# Patient Record
Sex: Female | Born: 1975
Health system: Southern US, Community
[De-identification: ages and names within clinical notes are randomized; demographics above are authoritative.]

## PROBLEM LIST (undated history)

## (undated) DIAGNOSIS — R87610 Atypical squamous cells of undetermined significance on cytologic smear of cervix (ASC-US): Secondary | ICD-10-CM

## (undated) HISTORY — DX: Atypical squamous cells of undetermined significance on cytologic smear of cervix (ASC-US): R87.610

---

## 2013-04-28 ENCOUNTER — Emergency Department (INDEPENDENT_AMBULATORY_CARE_PROVIDER_SITE_OTHER): Payer: Medicaid Other

## 2013-04-28 ENCOUNTER — Encounter (HOSPITAL_COMMUNITY): Payer: Self-pay | Admitting: *Deleted

## 2013-04-28 ENCOUNTER — Emergency Department (INDEPENDENT_AMBULATORY_CARE_PROVIDER_SITE_OTHER)
Admission: EM | Admit: 2013-04-28 | Discharge: 2013-04-28 | Disposition: A | Discharge status: Home / Self Care | Payer: Medicaid Other | Source: Home / Self Care

## 2013-04-28 DIAGNOSIS — K5900 Constipation, unspecified: Secondary | ICD-10-CM

## 2013-04-28 DIAGNOSIS — R109 Unspecified abdominal pain: Secondary | ICD-10-CM

## 2013-04-28 DIAGNOSIS — K5909 Other constipation: Secondary | ICD-10-CM

## 2013-04-28 LAB — POCT PREGNANCY, URINE: Preg Test, Ur: NEGATIVE

## 2013-04-28 LAB — POCT URINALYSIS DIP (DEVICE)
Bilirubin Urine: NEGATIVE
Ketones, ur: NEGATIVE mg/dL
Leukocytes, UA: NEGATIVE
Specific Gravity, Urine: 1.025 (ref 1.005–1.030)
pH: 7 (ref 5.0–8.0)

## 2013-04-28 MED ORDER — DOCUSATE SODIUM 100 MG PO CAPS: 100.0000 mg | ORAL_CAPSULE | Freq: Two times a day (BID) | ORAL | Status: DC | PRN

## 2013-04-28 MED ORDER — POLYETHYLENE GLYCOL 3350 17 G PO PACK: 17.0000 g | PACK | Freq: Every day | ORAL | Status: DC

## 2013-04-28 NOTE — ED Notes (Signed)
C/O intermittent left flank pain x approx 1 month, with gradual worsening.  When pain occurs it is stabbing and quite painful.  Denies any urinary sxs.  Has occasionally had some lower abd pain, but inconsistently.  Denies any vaginal discharge.  Denies fevers or vomiting.  Has had some slight nausea.  States pain is worse with sexual intercourse, sitting, or if any pressure applied to flank area.

## 2013-04-28 NOTE — Patient Instructions (Signed)
Place constipation patient instructions here.

## 2013-04-28 NOTE — ED Provider Notes (Signed)
History     CSN: [id number]  Arrival date & time 04/28/13  1120   First MD Initiated Contact with Patient 04/28/13 1200      Chief Complaint  Patient presents with  . Flank Pain    (Consider location/radiation/quality/duration/timing/severity/associated sxs/prior treatment) Patient is a 37 y.o. female presenting with constipation. The history is provided by the patient.  Constipation Severity:  No pain Timing:  Constant Progression:  Unchanged Chronicity:  Chronic Context: not dehydration, not dietary changes, not medication, not narcotics and not stress   Stool description:  Hard Unusual stool frequency:  Has one bm a week Worsened by:  Nothing tried Associated symptoms comment:  Gets intermittent pain in her left flank area that comes and goes.  No fever, chills, n,v or diarreha.  No urinary sx.   History reviewed. No pertinent past medical history.  History reviewed. No pertinent past surgical history.  No family history on file.  History  Substance Use Topics  . Smoking status: Never Smoker   . Smokeless tobacco: Not on file  . Alcohol Use: No    OB History   Grav Para Term Preterm Abortions TAB SAB Ect Mult Living                  Review of Systems  Gastrointestinal: Positive for constipation.  All other systems reviewed and are negative.    Allergies  Review of patient's allergies indicates no known allergies.  Home Medications   Current Outpatient Rx  Name  Route  Sig  Dispense  Refill  . Cyanocobalamin (VITAMIN B12 PO)   Oral   Take by mouth.         . docusate sodium (COLACE) 100 MG capsule   Oral   Take 1 capsule (100 mg total) by mouth 2 (two) times daily as needed for constipation.   30 capsule   3   . polyethylene glycol (MIRALAX) packet   Oral   Take 17 g by mouth daily.   14 each   3     BP 120/74  Pulse 57  Temp(Src) 98.1 F (36.7 C) (Oral)  Resp 16  SpO2 98%  LMP 04/19/2013  Physical Exam  Nursing note and  vitals reviewed. Constitutional: She is oriented to person, place, and time. She appears well-developed and well-nourished. No distress.  HENT:  Head: Normocephalic and atraumatic.  Eyes: Conjunctivae are normal. Pupils are equal, round, and reactive to light.  Neck: Normal range of motion. Neck supple.  Cardiovascular: Normal rate and regular rhythm.  Exam reveals no gallop and no friction rub.   No murmur heard. Pulmonary/Chest: Effort normal and breath sounds normal. No respiratory distress. She has no wheezes. She has no rales. She exhibits no tenderness.  Abdominal: Soft. Bowel sounds are normal. She exhibits no distension and no mass. There is no tenderness. There is no rebound and no guarding.  No CVA ttp  Musculoskeletal: Normal range of motion.  Neurological: She is alert and oriented to person, place, and time.  Skin: Skin is warm and dry. No rash noted. She is not diaphoretic. No erythema. No pallor.  Psychiatric: She has a normal mood and affect. Her behavior is normal.    ED Course  Procedures (including critical care time)  Labs Reviewed  POCT URINALYSIS DIP (DEVICE) - Abnormal; Notable for the following:    Protein, ur 30 (*)    All other components within normal limits  POCT PREGNANCY, URINE   Dg Abd 1 View  04/28/2013   *RADIOLOGY REPORT*  Clinical Data: Left flank pain.  Diarrhea.  ABDOMEN - 1 VIEW  Comparison: None.  Findings: There is a moderate amount of colonic gas but the pattern does not suggest obstruction.  No abnormal bony findings.  No abnormal soft tissue calcifications.  There are some phleboliths in the pelvis.  IMPRESSION: Within normal limits.  Moderate amount of colonic gas, but without a pathologic pattern.   Original Report Authenticated By: Paulina Fusi, M.D.     1. Flank pain   2. Chronic constipation       MDM  Discussed rx for conspiation.  See meds rx and pt ed.  Discussed that this is a crhornic problem for her and she likely needs to  change her diet.  Strategies revewiwed and meds reviewed.        Maryelizabeth Rowan, MD 04/28/13 1311

## 2013-04-28 NOTE — ED Notes (Signed)
Pt smiling, talkative, no difficulty moving.

## 2014-12-17 ENCOUNTER — Other Ambulatory Visit: Payer: Self-pay | Admitting: Gastroenterology

## 2014-12-17 DIAGNOSIS — R140 Abdominal distension (gaseous): Secondary | ICD-10-CM

## 2014-12-22 ENCOUNTER — Ambulatory Visit
Admission: RE | Admit: 2014-12-22 | Discharge: 2014-12-22 | Disposition: A | Discharge status: Home / Self Care | Payer: BLUE CROSS/BLUE SHIELD | Source: Ambulatory Visit | Attending: Gastroenterology | Admitting: Gastroenterology

## 2014-12-22 DIAGNOSIS — R140 Abdominal distension (gaseous): Secondary | ICD-10-CM

## 2014-12-26 ENCOUNTER — Ambulatory Visit: Payer: Self-pay | Admitting: Women's Health

## 2015-01-08 ENCOUNTER — Encounter: Payer: Self-pay | Admitting: Women's Health

## 2015-01-08 ENCOUNTER — Ambulatory Visit (INDEPENDENT_AMBULATORY_CARE_PROVIDER_SITE_OTHER): Payer: BLUE CROSS/BLUE SHIELD | Admitting: Women's Health

## 2015-01-08 ENCOUNTER — Other Ambulatory Visit (HOSPITAL_COMMUNITY)
Admission: RE | Admit: 2015-01-08 | Discharge: 2015-01-08 | Disposition: A | Discharge status: Home / Self Care | Payer: BLUE CROSS/BLUE SHIELD | Source: Ambulatory Visit | Attending: Women's Health | Admitting: Women's Health

## 2015-01-08 VITALS — BP 110/74 | Ht 65.0 in | Wt 128.0 lb

## 2015-01-08 DIAGNOSIS — N926 Irregular menstruation, unspecified: Secondary | ICD-10-CM

## 2015-01-08 DIAGNOSIS — B373 Candidiasis of vulva and vagina: Secondary | ICD-10-CM

## 2015-01-08 DIAGNOSIS — R87810 Cervical high risk human papillomavirus (HPV) DNA test positive: Secondary | ICD-10-CM | POA: Insufficient documentation

## 2015-01-08 DIAGNOSIS — Z01411 Encounter for gynecological examination (general) (routine) with abnormal findings: Secondary | ICD-10-CM | POA: Diagnosis present

## 2015-01-08 DIAGNOSIS — B3731 Acute candidiasis of vulva and vagina: Secondary | ICD-10-CM

## 2015-01-08 DIAGNOSIS — Z1151 Encounter for screening for human papillomavirus (HPV): Secondary | ICD-10-CM | POA: Insufficient documentation

## 2015-01-08 DIAGNOSIS — R1030 Lower abdominal pain, unspecified: Secondary | ICD-10-CM

## 2015-01-08 DIAGNOSIS — Z01419 Encounter for gynecological examination (general) (routine) without abnormal findings: Secondary | ICD-10-CM

## 2015-01-08 LAB — WET PREP FOR TRICH, YEAST, CLUE
CLUE CELLS WET PREP: NONE SEEN
Trich, Wet Prep: NONE SEEN
WBC, Wet Prep HPF POC: NONE SEEN
Yeast Wet Prep HPF POC: NONE SEEN

## 2015-01-08 MED ORDER — FLUCONAZOLE 100 MG PO TABS: ORAL_TABLET | ORAL | Status: DC

## 2015-01-08 NOTE — Progress Notes (Signed)
Ilona SorrelJatonya Jersey 12/20/1975 [id number]    History:    Presents for annual exam.  Monthly cycle/condoms/same partner 6 years. Reports cycles have changed in the past year, increased clots, every 3-5 weeks for 4 days. Has had a Mirena IUD in the past did not do well with. One abnormal Pap years ago with negative colposcopy. Has had problems with chronic constipation is on MiraLAX. Reports sharp low abdominal pain intermittent. Not always relieved with bowel movement. Moved here from [place].  Past medical history, past surgical history, family history and social history were all reviewed and documented in the EPIC chart. Works part-time at [employer]. Children 17, 11, 6 all doing well. 4 stepsons. Parents healthy living in [place].  ROS:  A ROS was performed and pertinent positives and negatives are included.  Exam:  Filed Vitals:   01/08/15 0952  BP: 110/74    General appearance:  Normal Thyroid:  Symmetrical, normal in size, without palpable masses or nodularity. Respiratory  Auscultation:  Clear without wheezing or rhonchi Cardiovascular  Auscultation:  Regular rate, without rubs, murmurs or gallops  Edema/varicosities:  Not grossly evident Abdominal  Soft,nontender, without masses, guarding or rebound.  Liver/spleen:  No organomegaly noted  Hernia:  None appreciated  Skin  Inspection:  Grossly normal   Breasts: Examined lying and sitting.     Right: Without masses, retractions, discharge or axillary adenopathy.     Left: Without masses, retractions, discharge or axillary adenopathy. Gentitourinary   Inguinal/mons:  Normal without inguinal adenopathy  External genitalia:  Normal  BUS/Urethra/Skene's glands:  Normal  Vagina:  Normal menses  Cervix:  Normal  Uterus:   normal in size, shape and contour.  Midline and mobile  Adnexa/parametria:     Rt: Without masses or tenderness.   Lt: Without masses or tenderness.  Anus and perineum: Normal  Digital rectal exam: Normal  sphincter tone without palpated masses or tenderness  Assessment/Plan:  39 y.o. SBF G3 P3 for annual exam intermittent lower abdominal sharp pain  Cycles every 3-5 weeks for 4 days/condoms Contraception management Chronic constipation better on MiraLAX  Plan: Contraception options reviewed, Nexplanon reviewed, reviewed possible spotting initially. Dr. Audie BoxFontaine to place with next cycle will check coverage. SBE's, exercise, calcium rich diet, vitamin D 1000 daily encouraged. Reviewed importance of increasing fiber rich foods in diet. Has had problems with chronic yeast in the past reports uses occasional Diflucan 200, prescription given. Aware to use sparingly. Office visit if no relief. Wet prep negative today, reports asymptomatic today. TSH, prolactin, UA, Pap with HR HPV typing. Ultrasound will schedule after this cycle.  Harrington ChallengerYOUNG,Rachelanne Whidby J [facility]WHNP, 10:48 AM 01/08/2015

## 2015-01-08 NOTE — Patient Instructions (Signed)

## 2015-01-09 ENCOUNTER — Encounter: Payer: Self-pay | Admitting: Women's Health

## 2015-01-09 LAB — URINALYSIS W MICROSCOPIC + REFLEX CULTURE
Bilirubin Urine: NEGATIVE
CASTS: NONE SEEN
Crystals: NONE SEEN
GLUCOSE, UA: NEGATIVE mg/dL
KETONES UR: NEGATIVE mg/dL
Nitrite: NEGATIVE
PH: 7 (ref 5.0–8.0)
PROTEIN: NEGATIVE mg/dL
Specific Gravity, Urine: 1.025 (ref 1.005–1.030)
Urobilinogen, UA: 1 mg/dL (ref 0.0–1.0)

## 2015-01-09 LAB — PROLACTIN: PROLACTIN: 6.3 ng/mL

## 2015-01-09 LAB — TSH: TSH: 1.099 u[IU]/mL (ref 0.350–4.500)

## 2015-01-10 LAB — URINE CULTURE
COLONY COUNT: NO GROWTH
ORGANISM ID, BACTERIA: NO GROWTH

## 2015-01-13 LAB — CYTOLOGY - PAP

## 2015-01-16 ENCOUNTER — Ambulatory Visit (INDEPENDENT_AMBULATORY_CARE_PROVIDER_SITE_OTHER): Payer: BLUE CROSS/BLUE SHIELD | Admitting: Women's Health

## 2015-01-16 ENCOUNTER — Ambulatory Visit (INDEPENDENT_AMBULATORY_CARE_PROVIDER_SITE_OTHER): Payer: BLUE CROSS/BLUE SHIELD

## 2015-01-16 ENCOUNTER — Encounter: Payer: Self-pay | Admitting: Women's Health

## 2015-01-16 DIAGNOSIS — R1030 Lower abdominal pain, unspecified: Secondary | ICD-10-CM

## 2015-01-16 DIAGNOSIS — R140 Abdominal distension (gaseous): Secondary | ICD-10-CM

## 2015-01-16 NOTE — Progress Notes (Signed)
Patient ID: Christy SorrelJatonya Allen, female   DOB: 09/24/1976, 39 y.o.   MRN: [id number] Presents for ultrasound. At annual exam was having low midline intermittent sharp pain abdominal discomfort and bloating. Has seen a GI doctor-negative workup.  Denies urinary symptoms, discharge, fever. Has had problems with intermittent constipation.  Ultrasound: Transvaginal uterus anteverted homogeneous. Endometrium normal. Right and left ovary normal. Left ovarian follicle seen 22 mm. Negative cul-de-sac. No apparent mass right or left adnexal.  Normal GYN ultrasound  Plan: Reviewed normality of ultrasound. Discussed diet, will decrease gluten in diet to see if that helps, continue to increase fiber rich foods.

## 2015-02-25 ENCOUNTER — Telehealth: Payer: Self-pay | Admitting: Gynecology

## 2015-02-25 NOTE — Telephone Encounter (Signed)
02/25/15-I LM VM for pt that her BC ins will cover the Nexplanon and insertion for contraception at 100%, no copay. She will call to let us know if she wants to proceed. Information was also left that it has to be inserted while on cycle with Dr. Valeta HarmsF/wl  [facility] REF #[id number]

## 2015-10-13 ENCOUNTER — Ambulatory Visit (INDEPENDENT_AMBULATORY_CARE_PROVIDER_SITE_OTHER): Payer: BLUE CROSS/BLUE SHIELD | Admitting: Women's Health

## 2015-10-13 ENCOUNTER — Encounter: Payer: Self-pay | Admitting: Women's Health

## 2015-10-13 VITALS — BP 110/78 | Ht 65.0 in | Wt 128.0 lb

## 2015-10-13 DIAGNOSIS — R350 Frequency of micturition: Secondary | ICD-10-CM

## 2015-10-13 DIAGNOSIS — B373 Candidiasis of vulva and vagina: Secondary | ICD-10-CM

## 2015-10-13 DIAGNOSIS — Z30011 Encounter for initial prescription of contraceptive pills: Secondary | ICD-10-CM

## 2015-10-13 DIAGNOSIS — B3731 Acute candidiasis of vulva and vagina: Secondary | ICD-10-CM

## 2015-10-13 DIAGNOSIS — A499 Bacterial infection, unspecified: Secondary | ICD-10-CM

## 2015-10-13 DIAGNOSIS — N760 Acute vaginitis: Secondary | ICD-10-CM

## 2015-10-13 DIAGNOSIS — B9689 Other specified bacterial agents as the cause of diseases classified elsewhere: Secondary | ICD-10-CM

## 2015-10-13 LAB — URINALYSIS W MICROSCOPIC + REFLEX CULTURE
BILIRUBIN URINE: NEGATIVE
CRYSTALS: NONE SEEN [HPF]
Casts: NONE SEEN [LPF]
GLUCOSE, UA: NEGATIVE
HGB URINE DIPSTICK: NEGATIVE
Ketones, ur: NEGATIVE
LEUKOCYTES UA: NEGATIVE
Nitrite: NEGATIVE
PH: 8 (ref 5.0–8.0)
PROTEIN: NEGATIVE
Specific Gravity, Urine: 1.02 (ref 1.001–1.035)
Yeast: NONE SEEN [HPF]

## 2015-10-13 LAB — WET PREP FOR TRICH, YEAST, CLUE
TRICH WET PREP: NONE SEEN
Yeast Wet Prep HPF POC: NONE SEEN

## 2015-10-13 MED ORDER — NORETHINDRONE ACET-ETHINYL EST 1-20 MG-MCG PO TABS: 1.0000 | ORAL_TABLET | Freq: Every day | ORAL | Status: DC

## 2015-10-13 MED ORDER — FLUCONAZOLE 100 MG PO TABS: ORAL_TABLET | ORAL | Status: DC

## 2015-10-13 MED ORDER — METRONIDAZOLE 0.75 % VA GEL: VAGINAL | Status: DC

## 2015-10-13 NOTE — Patient Instructions (Signed)

## 2015-10-13 NOTE — Progress Notes (Signed)
Patient ID: Christy SorrelJatonya Allen, female   DOB: 07/17/1976, 39 y.o.   MRN: [id number] Presents with complaint of increased discharge with odor, increased urinary frequency without pain for several days. One partner for years condoms inconsistently requesting other means of contraception. Had an IUD in the past with problems. [place] abdominal pain, fever or vaginal itching. Monthly cycle.  Exam: Appears well. External genitalia within normal limits, speculum exam moderate amount of a white adherent discharge noted with odor. Bimanual no CMT or adnexal tenderness. Wet prep positive for many clues, TNTC bacteria. UA: 0-5 WBCs, many bacteria, moderate clue cells.  Bacteria vaginosis Contraception management  Plan: MetroGel vaginal cream 1 applicator at bedtime 5, alcohol precautions reviewed, Diflucan 150 by mouth 1 dose reports frequent yeast after BV treated. Contraception options reviewed will try pills reviewed risks of blood clots and strokes. Has used in the past without problem.  Loestrin 1/20 prescription, proper use given and reviewed start up instructions, importance of condoms especially first month. Return to office in February for annual exam.. Urine Culture pending.

## 2015-10-14 LAB — URINE CULTURE
Colony Count: NO GROWTH
Organism ID, Bacteria: NO GROWTH

## 2016-08-31 ENCOUNTER — Encounter: Payer: BLUE CROSS/BLUE SHIELD | Admitting: Women's Health

## 2016-08-31 DIAGNOSIS — Z0289 Encounter for other administrative examinations: Secondary | ICD-10-CM

## 2016-12-21 ENCOUNTER — Encounter: Payer: BLUE CROSS/BLUE SHIELD | Admitting: Women's Health

## 2016-12-27 ENCOUNTER — Encounter: Payer: BLUE CROSS/BLUE SHIELD | Admitting: Women's Health

## 2017-01-05 DIAGNOSIS — R87610 Atypical squamous cells of undetermined significance on cytologic smear of cervix (ASC-US): Secondary | ICD-10-CM

## 2017-01-05 HISTORY — DX: Atypical squamous cells of undetermined significance on cytologic smear of cervix (ASC-US): R87.610

## 2017-01-11 ENCOUNTER — Ambulatory Visit (INDEPENDENT_AMBULATORY_CARE_PROVIDER_SITE_OTHER): Payer: 59 | Admitting: Women's Health

## 2017-01-11 ENCOUNTER — Encounter: Payer: Self-pay | Admitting: Women's Health

## 2017-01-11 VITALS — BP 102/70 | Ht 65.0 in | Wt 139.0 lb

## 2017-01-11 DIAGNOSIS — B3731 Acute candidiasis of vulva and vagina: Secondary | ICD-10-CM | POA: Insufficient documentation

## 2017-01-11 DIAGNOSIS — Z01411 Encounter for gynecological examination (general) (routine) with abnormal findings: Secondary | ICD-10-CM

## 2017-01-11 DIAGNOSIS — L298 Other pruritus: Secondary | ICD-10-CM

## 2017-01-11 DIAGNOSIS — B373 Candidiasis of vulva and vagina: Secondary | ICD-10-CM

## 2017-01-11 DIAGNOSIS — N760 Acute vaginitis: Secondary | ICD-10-CM | POA: Diagnosis not present

## 2017-01-11 DIAGNOSIS — Z1151 Encounter for screening for human papillomavirus (HPV): Secondary | ICD-10-CM | POA: Diagnosis not present

## 2017-01-11 DIAGNOSIS — N898 Other specified noninflammatory disorders of vagina: Secondary | ICD-10-CM

## 2017-01-11 DIAGNOSIS — Z01419 Encounter for gynecological examination (general) (routine) without abnormal findings: Secondary | ICD-10-CM

## 2017-01-11 DIAGNOSIS — B9689 Other specified bacterial agents as the cause of diseases classified elsewhere: Secondary | ICD-10-CM | POA: Diagnosis not present

## 2017-01-11 LAB — WET PREP FOR TRICH, YEAST, CLUE: TRICH WET PREP: NONE SEEN

## 2017-01-11 MED ORDER — FLUCONAZOLE 100 MG PO TABS: ORAL_TABLET | ORAL | 0 refills | Status: DC

## 2017-01-11 MED ORDER — METRONIDAZOLE 500 MG PO TABS
500.0000 mg | ORAL_TABLET | Freq: Two times a day (BID) | ORAL | 0 refills | Status: DC
Start: 2017-01-11 — End: 2017-02-06

## 2017-01-11 NOTE — Progress Notes (Signed)
Christy SorrelJatonya Allen 05/16/1976 [id number]    History:    Presents for annual exam.  Regular monthly cycle with pain with intercourse, with cycle and week prior to cycle. Describes it as a shooting sharp stabbing pain which has occurred over the past 10 years but increasingly painful. Normal ultrasound history. Reports one abnormal Pap years ago with a negative colposcopy and biopsy. Has had problems with chronic constipation uses MiraLAX and stool softener. Monthly cycle using no contraception for the past 7 years with no conception. Children are 19, 13 and 8.. Also has 4 stepsons 1 lives with them full-time. Had skin abscess this past year on buttock.  Past medical history, past surgical history, family history and social history were all reviewed and documented in the EPIC chart. Works at [employer], and [employer] on the weekends. Parents healthy, live in [place].  ROS:  A ROS was performed and pertinent positives and negatives are included.  Exam:  Vitals:   01/11/17 1602  BP: 102/70  Weight: 139 lb (63 kg)  Height: 5\' 5"  (1.651 m)   Body mass index is 23.13 kg/m.   General appearance:  Normal Thyroid:  Symmetrical, normal in size, without palpable masses or nodularity. Respiratory  Auscultation:  Clear without wheezing or rhonchi Cardiovascular  Auscultation:  Regular rate, without rubs, murmurs or gallops  Edema/varicosities:  Not grossly evident Abdominal  Soft,nontender, without masses, guarding or rebound.  Liver/spleen:  No organomegaly noted  Hernia:  None appreciated  Skin  Inspection:  Grossly normal   Breasts: Examined lying and sitting.     Right: Without masses, retractions, discharge or axillary adenopathy.     Left: Without masses, retractions, discharge or axillary adenopathy. Gentitourinary   Inguinal/mons:  Normal without inguinal adenopathy  External genitalia:  Normal  BUS/Urethra/Skene's glands:  Normal  Vagina:  Erythematous, wet prep positive for  yeast, moderate clues, TNTC bacteria  Cervix:  Normal  Uterus:  , normal in size, shape and contour.  Midline and mobile  Adnexa/parametria: Nontender with exam    Rt: Without masses or tenderness.   Lt: Without masses or tenderness.  Anus and perineum: Normal  Digital rectal exam: Normal sphincter tone without palpated masses or tenderness  Assessment/Plan:  41 y.o. SBF G4 P3 for annual exam.    Monthly cycle using no contraception pregnancy okay Sharp, shooting, stabbing type pain low pelvis with intercourse, week prior to cycle and with cycle History of constipation-uses MiraLAX and Colace when necessary Bacterial vaginosis, yeast vaginitis History of recurrent yeast vaginitis  Plan:  Reviewed endometriosis may be a culprit of long-standing pelvic discomfort. Reviewed definitive diagnosis is with laparoscopic surgery. Reviewed if would like to pursue to schedule an appointment with Dr. Audie BoxFontaine to discuss. Flagyl 500 twice daily for 7 days, alcohol precautions reviewed. Diflucan 200 by mouth today repeat in 3 days if needed. SBE's, annual screening mammogram at 40, calcium rich diet, MVI daily encouraged. Reports normal health screening labs for work. UA, Pap with HR HPV typing.    Harrington ChallengerYOUNG,Kemontae Dunklee J [facility]WHNP, 4:39 PM 01/11/2017

## 2017-01-11 NOTE — Patient Instructions (Signed)
Bacterial Vaginosis Bacterial vaginosis is an infection of the vagina. It happens when too many germs (bacteria) grow in the vagina. This infection puts you at risk for infections from sex (STIs). Treating this infection can lower your risk for some STIs. You should also treat this if you are pregnant. It can cause your baby to be born early. Follow these instructions at home: Medicines  Take over-the-counter and prescription medicines only as told by your doctor.  Take or use your antibiotic medicine as told by your doctor. Do not stop taking or using it even if you start to feel better. General instructions  If you your sexual partner is a woman, tell her that you have this infection. She needs to get treatment if she has symptoms. If you have a female partner, he does not need to be treated.  During treatment:  Avoid sex.  Do not douche.  Avoid alcohol as told.  Avoid breastfeeding as told.  Drink enough fluid to keep your pee (urine) clear or pale yellow.  Keep your vagina and butt (rectum) clean.  Wash the area with warm water every day.  Wipe from front to back after you use the toilet.  Keep all follow-up visits as told by your doctor. This is important. Preventing this condition  Do not douche.  Use only warm water to wash around your vagina.  Use protection when you have sex. This includes:  Latex condoms.  Dental dams.  Limit how many people you have sex with. It is best to only have sex with the same person (be monogamous).  Get tested for STIs. Have your partner get tested.  Wear underwear that is cotton or lined with cotton.  Avoid tight pants and pantyhose. This is most important in summer.  Do not use any products that have nicotine or tobacco in them. These include cigarettes and e-cigarettes. If you need help quitting, ask your doctor.  Do not use illegal drugs.  Limit how much alcohol you drink. Contact a doctor if:  Your symptoms do not get  better, even after you are treated.  You have more discharge or pain when you pee (urinate).  You have a fever.  You have pain in your belly (abdomen).  You have pain with sex.  Your bleed from your vagina between periods. Summary  This infection happens when too many germs (bacteria) grow in the vagina.  Treating this condition can lower your risk for some infections from sex (STIs).  You should also treat this if you are pregnant. It can cause early (premature) birth.  Do not stop taking or using your antibiotic medicine even if you start to feel better. This information is not intended to replace advice given to you by your health care provider. Make sure you discuss any questions you have with your health care provider. Document Released: 08/30/2008 Document Revised: 08/06/2016 Document Reviewed: 08/06/2016 Elsevier Interactive Patient Education  2017 [employer] Health Maintenance, Female Introduction Adopting a healthy lifestyle and getting preventive care can go a long way to promote health and wellness. Talk with your health care provider about what schedule of regular examinations is right for you. This is a good chance for you to check in with your provider about disease prevention and staying healthy. In between checkups, there are plenty of things you can do on your own. Experts have done a lot of research about which lifestyle changes and preventive measures are most likely to keep you healthy. Ask your health  care provider for more information. Weight and diet Eat a healthy diet  Be sure to include plenty of vegetables, fruits, low-fat dairy products, and lean protein.  Do not eat a lot of foods high in solid fats, added sugars, or salt.  Get regular exercise. This is one of the most important things you can do for your health.  Most adults should exercise for at least 150 minutes each week. The exercise should increase your heart rate and make you sweat  (moderate-intensity exercise).  Most adults should also do strengthening exercises at least twice a week. This is in addition to the moderate-intensity exercise. Maintain a healthy weight  Body mass index (BMI) is a measurement that can be used to identify possible weight problems. It estimates body fat based on height and weight. Your health care provider can help determine your BMI and help you achieve or maintain a healthy weight.  For females 76 years of age and older:  A BMI below 18.5 is considered underweight.  A BMI of 18.5 to 24.9 is normal.  A BMI of 25 to 29.9 is considered overweight.  A BMI of 30 and above is considered obese. Watch levels of cholesterol and blood lipids  You should start having your blood tested for lipids and cholesterol at 39 years of age, then have this test every 5 years.  You may need to have your cholesterol levels checked more often if:  Your lipid or cholesterol levels are high.  You are older than 39 years of age.  You are at high risk for heart disease. Cancer screening Lung Cancer  Lung cancer screening is recommended for adults 50-62 years old who are at high risk for lung cancer because of a history of smoking.  A yearly low-dose CT scan of the lungs is recommended for people who:  Currently smoke.  Have quit within the past 15 years.  Have at least a 30-pack-year history of smoking. A pack year is smoking an average of one pack of cigarettes a day for 1 year.  Yearly screening should continue until it has been 15 years since you quit.  Yearly screening should stop if you develop a health problem that would prevent you from having lung cancer treatment. Breast Cancer  Practice breast self-awareness. This means understanding how your breasts normally appear and feel.  It also means doing regular breast self-exams. Let your health care provider know about any changes, no matter how small.  If you are in your 20s or 30s, you  should have a clinical breast exam (CBE) by a health care provider every 1-3 years as part of a regular health exam.  If you are 61 or older, have a CBE every year. Also consider having a breast X-ray (mammogram) every year.  If you have a family history of breast cancer, talk to your health care provider about genetic screening.  If you are at high risk for breast cancer, talk to your health care provider about having an MRI and a mammogram every year.  Breast cancer gene (BRCA) assessment is recommended for women who have family members with BRCA-related cancers. BRCA-related cancers include:  Breast.  Ovarian.  Tubal.  Peritoneal cancers.  Results of the assessment will determine the need for genetic counseling and BRCA1 and BRCA2 testing. Cervical Cancer  Your health care provider may recommend that you be screened regularly for cancer of the pelvic organs (ovaries, uterus, and vagina). This screening involves a pelvic examination, including checking for  microscopic changes to the surface of your cervix (Pap test). You may be encouraged to have this screening done every 3 years, beginning at age 47.  For women ages 38-65, health care providers may recommend pelvic exams and Pap testing every 3 years, or they may recommend the Pap and pelvic exam, combined with testing for human papilloma virus (HPV), every 5 years. Some types of HPV increase your risk of cervical cancer. Testing for HPV may also be done on women of any age with unclear Pap test results.  Other health care providers may not recommend any screening for nonpregnant women who are considered low risk for pelvic cancer and who do not have symptoms. Ask your health care provider if a screening pelvic exam is right for you.  If you have had past treatment for cervical cancer or a condition that could lead to cancer, you need Pap tests and screening for cancer for at least 20 years after your treatment. If Pap tests have been  discontinued, your risk factors (such as having a new sexual partner) need to be reassessed to determine if screening should resume. Some women have medical problems that increase the chance of getting cervical cancer. In these cases, your health care provider may recommend more frequent screening and Pap tests. Colorectal Cancer  This type of cancer can be detected and often prevented.  Routine colorectal cancer screening usually begins at 39 years of age and continues through 39 years of age.  Your health care provider may recommend screening at an earlier age if you have risk factors for colon cancer.  Your health care provider may also recommend using home test kits to check for hidden blood in the stool.  A small camera at the end of a tube can be used to examine your colon directly (sigmoidoscopy or colonoscopy). This is done to check for the earliest forms of colorectal cancer.  Routine screening usually begins at age 35.  Direct examination of the colon should be repeated every 5-10 years through 39 years of age. However, you may need to be screened more often if early forms of precancerous polyps or small growths are found. Skin Cancer  Check your skin from head to toe regularly.  Tell your health care provider about any new moles or changes in moles, especially if there is a change in a mole's shape or color.  Also tell your health care provider if you have a mole that is larger than the size of a pencil eraser.  Always use sunscreen. Apply sunscreen liberally and repeatedly throughout the day.  Protect yourself by wearing long sleeves, pants, a wide-brimmed hat, and sunglasses whenever you are outside. Heart disease, diabetes, and high blood pressure  High blood pressure causes heart disease and increases the risk of stroke. High blood pressure is more likely to develop in:  People who have blood pressure in the high end of the normal range (130-139/85-89 mm Hg).  People  who are overweight or obese.  People who are African American.  If you are 36-37 years of age, have your blood pressure checked every 3-5 years. If you are 35 years of age or older, have your blood pressure checked every year. You should have your blood pressure measured twice-once when you are at a hospital or clinic, and once when you are not at a hospital or clinic. Record the average of the two measurements. To check your blood pressure when you are not at a hospital or clinic, you  can use:  An automated blood pressure machine at a pharmacy.  A home blood pressure monitor.  If you are between 84 years and 17 years old, ask your health care provider if you should take aspirin to prevent strokes.  Have regular diabetes screenings. This involves taking a blood sample to check your fasting blood sugar level.  If you are at a normal weight and have a low risk for diabetes, have this test once every three years after 39 years of age.  If you are overweight and have a high risk for diabetes, consider being tested at a younger age or more often. Preventing infection Hepatitis B  If you have a higher risk for hepatitis B, you should be screened for this virus. You are considered at high risk for hepatitis B if:  You were born in a country where hepatitis B is common. Ask your health care provider which countries are considered high risk.  Your parents were born in a high-risk country, and you have not been immunized against hepatitis B (hepatitis B vaccine).  You have HIV or AIDS.  You use needles to inject street drugs.  You live with someone who has hepatitis B.  You have had sex with someone who has hepatitis B.  You get hemodialysis treatment.  You take certain medicines for conditions, including cancer, organ transplantation, and autoimmune conditions. Hepatitis C  Blood testing is recommended for:  Everyone born from [birth year] through 1963.  Anyone with known risk factors for  hepatitis C. Sexually transmitted infections (STIs)  You should be screened for sexually transmitted infections (STIs) including gonorrhea and chlamydia if:  You are sexually active and are younger than 39 years of age.  You are older than 39 years of age and your health care provider tells you that you are at risk for this type of infection.  Your sexual activity has changed since you were last screened and you are at an increased risk for chlamydia or gonorrhea. Ask your health care provider if you are at risk.  If you do not have HIV, but are at risk, it may be recommended that you take a prescription medicine daily to prevent HIV infection. This is called pre-exposure prophylaxis (PrEP). You are considered at risk if:  You are sexually active and do not regularly use condoms or know the HIV status of your partner(s).  You take drugs by injection.  You are sexually active with a partner who has HIV. Talk with your health care provider about whether you are at high risk of being infected with HIV. If you choose to begin PrEP, you should first be tested for HIV. You should then be tested every 3 months for as long as you are taking PrEP. Pregnancy  If you are premenopausal and you may become pregnant, ask your health care provider about preconception counseling.  If you may become pregnant, take 400 to 800 micrograms (mcg) of folic acid every day.  If you want to prevent pregnancy, talk to your health care provider about birth control (contraception). Osteoporosis and menopause  Osteoporosis is a disease in which the bones lose minerals and strength with aging. This can result in serious bone fractures. Your risk for osteoporosis can be identified using a bone density scan.  If you are 30 years of age or older, or if you are at risk for osteoporosis and fractures, ask your health care provider if you should be screened.  Ask your health care provider whether you  should take a calcium  or vitamin D supplement to lower your risk for osteoporosis.  Menopause may have certain physical symptoms and risks.  Hormone replacement therapy may reduce some of these symptoms and risks. Talk to your health care provider about whether hormone replacement therapy is right for you. Follow these instructions at home:  Schedule regular health, dental, and eye exams.  Stay current with your immunizations.  Do not use any tobacco products including cigarettes, chewing tobacco, or electronic cigarettes.  If you are pregnant, do not drink alcohol.  If you are breastfeeding, limit how much and how often you drink alcohol.  Limit alcohol intake to no more than 1 drink per day for nonpregnant women. One drink equals 12 ounces of beer, 5 ounces of wine, or 1 ounces of hard liquor.  Do not use street drugs.  Do not share needles.  Ask your health care provider for help if you need support or information about quitting drugs.  Tell your health care provider if you often feel depressed.  Tell your health care provider if you have ever been abused or do not feel safe at home. This information is not intended to replace advice given to you by your health care provider. Make sure you discuss any questions you have with your health care provider. Document Released: 06/06/2011 Document Revised: 04/28/2016 Document Reviewed: 08/25/2015  2017 Elsevier

## 2017-01-12 LAB — URINALYSIS W MICROSCOPIC + REFLEX CULTURE
BILIRUBIN URINE: NEGATIVE
Casts: NONE SEEN [LPF]
Crystals: NONE SEEN [HPF]
GLUCOSE, UA: NEGATIVE
Hgb urine dipstick: NEGATIVE
Ketones, ur: NEGATIVE
Nitrite: NEGATIVE
SPECIFIC GRAVITY, URINE: 1.025 (ref 1.001–1.035)
Yeast: NONE SEEN [HPF]
pH: 8 (ref 5.0–8.0)

## 2017-01-14 LAB — URINE CULTURE

## 2017-01-16 LAB — PAP, TP IMAGING W/ HPV RNA, RFLX HPV TYPE 16,18/45: HPV mRNA, High Risk: DETECTED — AB

## 2017-01-17 LAB — HPV TYPE 16 AND 18/45 RNA
HPV TYPE 16 RNA: NOT DETECTED
HPV Type 18/45 RNA: NOT DETECTED

## 2017-02-06 ENCOUNTER — Ambulatory Visit (INDEPENDENT_AMBULATORY_CARE_PROVIDER_SITE_OTHER): Payer: 59 | Admitting: Gynecology

## 2017-02-06 ENCOUNTER — Encounter: Payer: Self-pay | Admitting: Gynecology

## 2017-02-06 DIAGNOSIS — R87811 Vaginal high risk human papillomavirus (HPV) DNA test positive: Secondary | ICD-10-CM

## 2017-02-06 DIAGNOSIS — R87620 Atypical squamous cells of undetermined significance on cytologic smear of vagina (ASC-US): Secondary | ICD-10-CM | POA: Diagnosis not present

## 2017-02-06 NOTE — Progress Notes (Signed)
    Christy Allen 08/08/1976 [id number]        41 y.o.  [id number] presents for colposcopy. History of normal cytology was positive high-risk HPV, negative subtype 16, 18/45 2016. Recent Pap smear 01/2017 showed ASCUS with positive high-risk HPV.  Past medical history,surgical history, problem list, medications, allergies, family history and social history were all reviewed and documented in the EPIC chart.  Directed ROS with pertinent positives and negatives documented in the history of present illness/assessment and plan.  Exam: Christy Allen assistant Vitals:   02/06/17 1503  BP: 116/74   General appearance:  Normal Abdomen soft nontender without masses guarding rebound Pelvic external BUS vagina normal. Cervix grossly normal. Uterus normal size midline mobile nontender. Adnexa without masses or tenderness.  Colposcopy performed after acetic acid cleanse is adequate and normal. Transformation zone visualized 360. ECC performed. Patient tolerated well. Physical Exam  Genitourinary:      Assessment/Plan:  41 y.o. Christy Allen with persistent positive high-risk HPV. Most recent Pap smear showing ASCUS. Colposcopy is adequate normal. ECC performed. I discussed with the patient the whole issue of dysplasia, high-grade/low-grade, progression/regression. I reviewed the whole issue of HPV and the possibilities of spontaneous regression over time. Patient will follow up for ECC results. Assuming negative them plan expectant management with all of Pap smear next year at annual exam. If otherwise then will triage based upon results    Dara LordsFONTAINE,Christy Allen P MD, 3:50 PM 02/06/2017

## 2017-02-06 NOTE — Patient Instructions (Signed)
office will call you with biopsy results 

## 2017-02-07 ENCOUNTER — Encounter: Payer: Self-pay | Admitting: Gynecology

## 2017-02-07 LAB — PATHOLOGY

## 2017-04-19 ENCOUNTER — Encounter: Payer: Self-pay | Admitting: Gynecology

## 2017-07-18 ENCOUNTER — Ambulatory Visit (HOSPITAL_COMMUNITY)
Admission: EM | Admit: 2017-07-18 | Discharge: 2017-07-18 | Disposition: A | Discharge status: Home / Self Care | Payer: 59 | Attending: Emergency Medicine | Admitting: Emergency Medicine

## 2017-07-18 ENCOUNTER — Encounter (HOSPITAL_COMMUNITY): Payer: Self-pay | Admitting: Emergency Medicine

## 2017-07-18 DIAGNOSIS — S39012A Strain of muscle, fascia and tendon of lower back, initial encounter: Secondary | ICD-10-CM | POA: Diagnosis not present

## 2017-07-18 DIAGNOSIS — M25551 Pain in right hip: Secondary | ICD-10-CM

## 2017-07-18 DIAGNOSIS — T148XXA Other injury of unspecified body region, initial encounter: Secondary | ICD-10-CM | POA: Diagnosis not present

## 2017-07-18 MED ORDER — DICLOFENAC POTASSIUM 50 MG PO TABS: 50.0000 mg | ORAL_TABLET | Freq: Three times a day (TID) | ORAL | 0 refills | Status: DC

## 2017-07-18 MED ORDER — METHOCARBAMOL 500 MG PO TABS: 500.0000 mg | ORAL_TABLET | Freq: Two times a day (BID) | ORAL | 0 refills | Status: DC

## 2017-07-18 MED ORDER — TRAMADOL HCL 50 MG PO TABS: 50.0000 mg | ORAL_TABLET | Freq: Four times a day (QID) | ORAL | 0 refills | Status: DC | PRN

## 2017-07-18 NOTE — ED Provider Notes (Signed)
[facility]    CSN: [id number] Arrival date & time: 07/18/17  1612     History   Chief Complaint Chief Complaint  Patient presents with  . Back Pain    right    HPI Christy Allen is a 41 y.o. female.   -year-old female complaining of low back pain radiating to the right hip for one week. The pain is located primarily to the right lateral upper lumbar muscle, the right upper buttock and passed this lateral rales. She states this all occurred when she leaned over to pick up her purse one week ago from today. She is now having back spasms. Denies focal paresthesias or weakness. Pain is worse with movement, sitting up, bending over.      Past Medical History:  Diagnosis Date  . ASCUS of cervix with negative high risk HPV 01/2017   Colposcopy adequate normal. ECC negative    There are no active problems to display for this patient.   History reviewed. No pertinent surgical history.  OB History    Gravida Para Term Preterm AB Living   4 3 3  0 1 3   SAB TAB Ectopic Multiple Live Births   0 1 0 0         Home Medications    Prior to Admission medications   Medication Sig Start Date End Date Taking? Authorizing Provider  Cyanocobalamin (VITAMIN B12 PO) Take 1 tablet by mouth daily.     [provider]  diclofenac (CATAFLAM) 50 MG tablet Take 1 tablet (50 mg total) by mouth 3 (three) times daily. One tablet TID with food prn pain. 07/18/17   Hayden RasmussenMabe, Sahalie Beth, NP  methocarbamol (ROBAXIN) 500 MG tablet Take 1 tablet (500 mg total) by mouth 2 (two) times daily. 07/18/17   Hayden RasmussenMabe, Jennier Schissler, NP  Multiple Vitamin (MULTIVITAMIN WITH MINERALS) TABS tablet Take 1 tablet by mouth daily.    [provider]  traMADol (ULTRAM) 50 MG tablet Take 1 tablet (50 mg total) by mouth every 6 (six) hours as needed. 07/18/17   Hayden RasmussenMabe, Josemaria Brining, NP    Family History History reviewed. No pertinent family history.  Social History Social History  Substance Use Topics  . Smoking  status: Never Smoker  . Smokeless tobacco: Never Used  . Alcohol use No     Allergies   Patient has no known allergies.   Review of Systems Review of Systems  Constitutional: Negative for activity change, chills and fever.  HENT: Negative.   Respiratory: Negative.   Cardiovascular: Negative.   Musculoskeletal:       As per HPI  Skin: Negative for color change, pallor and rash.  Neurological: Negative.   All other systems reviewed and are negative.    Physical Exam Triage Vital Signs ED Triage Vitals  Enc Vitals Group     BP 07/18/17 1633 (!) 107/55     Pulse Rate 07/18/17 1633 68     Resp --      Temp 07/18/17 1633 98.6 F (37 C)     Temp Source 07/18/17 1633 Oral     SpO2 07/18/17 1633 99 %     Weight --      Height --      Head Circumference --      Peak Flow --      Pain Score 07/18/17 1635 10     Pain Loc --      Pain Edu? --      Excl. in GC? --  No data found.   Updated Vital Signs BP (!) 107/55 (BP Location: Left Arm)   Pulse 68   Temp 98.6 F (37 C) (Oral)   LMP 07/08/2017 (Exact Date)   SpO2 99%   Visual Acuity Right Eye Distance:   Left Eye Distance:   Bilateral Distance:    Right Eye Near:   Left Eye Near:    Bilateral Near:     Physical Exam  Constitutional: She is oriented to person, place, and time. She appears well-developed and well-nourished. No distress.  HENT:  Head: Normocephalic and atraumatic.  Eyes: Pupils are equal, round, and reactive to light. EOM are normal.  Neck: Normal range of motion. Neck supple.  Musculoskeletal: She exhibits no edema or deformity.  Tenderness to the right para lumbosacral musculature, middle right lateral hip and upper buttock muscles. Pain is demonstrated to be worse when she lifts the right leg producing pain in areas described above. Denies distal weakness or paresthesia. Strength is normal in the right lower extremity. No tenderness along the lumbar spine. She is able to walk but with some  pain in the same musculature.  Neurological: She is alert and oriented to person, place, and time. No cranial nerve deficit.  Skin: Skin is warm and dry.     UC Treatments / Results  Labs (all labs ordered are listed, but only abnormal results are displayed) Labs Reviewed - No data to display  EKG  EKG Interpretation None       Radiology No results found.  Procedures Procedures (including critical care time)  Medications Ordered in UC Medications - No data to display   Initial Impression / Assessment and Plan / UC Course  I have reviewed the triage vital signs and the nursing notes.  Pertinent labs & imaging results that were available during my care of the patient were reviewed by me and considered in my medical decision making (see chart for details).    Apply heat to the area and Salonpas patches. Perform gentle stretches as demonstrated. No heavy lifting or bending. Keep back straight. Use good posture. Medications as directed. May cause drowsiness.     Final Clinical Impressions(s) / UC Diagnoses   Final diagnoses:  Strain of lumbar region, initial encounter  Muscle strain    New Prescriptions New Prescriptions   DICLOFENAC (CATAFLAM) 50 MG TABLET    Take 1 tablet (50 mg total) by mouth 3 (three) times daily. One tablet TID with food prn pain.   METHOCARBAMOL (ROBAXIN) 500 MG TABLET    Take 1 tablet (500 mg total) by mouth 2 (two) times daily.   TRAMADOL (ULTRAM) 50 MG TABLET    Take 1 tablet (50 mg total) by mouth every 6 (six) hours as needed.     Controlled Substance Prescriptions [place] Controlled Substance Registry consulted? No   Hayden Rasmussen, NP 07/18/17 [phone]

## 2017-07-18 NOTE — ED Triage Notes (Signed)
Pt reports right lower back spasms that radiate to her right hip that have been going on for one week.  She denies any injury or fall.

## 2017-07-18 NOTE — Discharge Instructions (Signed)
Apply heat to the area and Salonpas patches. Perform gentle stretches as demonstrated. No heavy lifting or bending. Keep back straight. Use good posture. Medications as directed. May cause drowsiness.

## 2017-09-12 ENCOUNTER — Encounter (HOSPITAL_COMMUNITY): Payer: Self-pay | Admitting: Emergency Medicine

## 2017-09-12 ENCOUNTER — Ambulatory Visit (HOSPITAL_COMMUNITY)
Admission: EM | Admit: 2017-09-12 | Discharge: 2017-09-12 | Disposition: A | Discharge status: Home / Self Care | Payer: 59 | Attending: Family Medicine | Admitting: Family Medicine

## 2017-09-12 DIAGNOSIS — M545 Low back pain, unspecified: Secondary | ICD-10-CM

## 2017-09-12 MED ORDER — NAPROXEN 500 MG PO TBEC: 500.0000 mg | DELAYED_RELEASE_TABLET | Freq: Two times a day (BID) | ORAL | 0 refills | Status: DC

## 2017-09-12 MED ORDER — CYCLOBENZAPRINE HCL 10 MG PO TABS: 10.0000 mg | ORAL_TABLET | Freq: Three times a day (TID) | ORAL | 0 refills | Status: DC | PRN

## 2017-09-12 NOTE — Discharge Instructions (Signed)
Heat (pad or rice pillow in microwave) over affected area, 10-15 minutes every 2-3 hours while awake.   OK to take Tylenol 1000 mg (2 extra strength tabs) or 975 mg (3 regular strength tabs) every 6 hours as needed.  Call PCP in 1-2 weeks if no better for an appointment.  EXERCISES  RANGE OF MOTION (ROM) AND STRETCHING EXERCISES - Low Back Sprain Most people with lower back pain will find that their symptoms get worse with excessive bending forward (flexion) or arching at the lower back (extension). The exercises that will help resolve your symptoms will focus on the opposite motion.  Your physician, physical therapist or athletic trainer will help you determine which exercises will be most helpful to resolve your lower back pain. Do not complete any exercises without first consulting with your caregiver. Discontinue any exercises which make your symptoms worse, until you speak to your caregiver. If you have pain, numbness or tingling which travels down into your buttocks, leg or foot, the goal of the therapy is for these symptoms to move closer to your back and eventually resolve. Sometimes, these leg symptoms will get better, but your lower back pain may worsen. This is often an indication of progress in your rehabilitation. Be very alert to any changes in your symptoms and the activities in which you participated in the 24 hours prior to the change. Sharing this information with your caregiver will allow him or her to most efficiently treat your condition. These exercises may help you when beginning to rehabilitate your injury. Your symptoms may resolve with or without further involvement from your physician, physical therapist or athletic trainer. While completing these exercises, remember:  Restoring tissue flexibility helps normal motion to return to the joints. This allows healthier, less painful movement and activity. An effective stretch should be held for at least 30 seconds. A stretch should  never be painful. You should only feel a gentle lengthening or release in the stretched tissue. FLEXION RANGE OF MOTION AND STRETCHING EXERCISES:  STRETCH - Flexion, Single Knee to Chest  Lie on a firm bed or floor with both legs extended in front of you. Keeping one leg in contact with the floor, bring your opposite knee to your chest. Hold your leg in place by either grabbing behind your thigh or at your knee. Pull until you feel a gentle stretch in your low back. Hold 15-20 seconds. Slowly release your grasp and repeat the exercise with the opposite side. Repeat 2 times. Complete this exercise 1-2 times per day.   STRETCH - Flexion, Double Knee to Chest Lie on a firm bed or floor with both legs extended in front of you. Keeping one leg in contact with the floor, bring your opposite knee to your chest. Tense your stomach muscles to support your back and then lift your other knee to your chest. Hold your legs in place by either grabbing behind your thighs or at your knees. Pull both knees toward your chest until you feel a gentle stretch in your low back. Hold 15-20 seconds. Tense your stomach muscles and slowly return one leg at a time to the floor. Repeat 2 times. Complete this exercise 1-2 times per day.   STRETCH - Low Trunk Rotation Lie on a firm bed or floor. Keeping your legs in front of you, bend your knees so they are both pointed toward the ceiling and your feet are flat on the floor. Extend your arms out to the side. This will stabilize  your upper body by keeping your shoulders in contact with the floor. Gently and slowly drop both knees together to one side until you feel a gentle stretch in your low back. Hold for 15-20 seconds. Tense your stomach muscles to support your lower back as you bring your knees back to the starting position. Repeat the exercise to the other side. Repeat 2 times. Complete this exercise 1-2 times per day  EXTENSION RANGE OF MOTION AND FLEXIBILITY  EXERCISES:  STRETCH - Extension, Prone on Elbows  Lie on your stomach on the floor, a bed will be too soft. Place your palms about shoulder width apart and at the height of your head. Place your elbows under your shoulders. If this is too painful, stack pillows under your chest. Allow your body to relax so that your hips drop lower and make contact more completely with the floor. Hold this position for 15-20 seconds. Slowly return to lying flat on the floor. Repeat 2 times. Complete this exercise 1-2 times per day.   RANGE OF MOTION - Extension, Prone Press Ups Lie on your stomach on the floor, a bed will be too soft. Place your palms about shoulder width apart and at the height of your head. Keeping your back as relaxed as possible, slowly straighten your elbows while keeping your hips on the floor. You may adjust the placement of your hands to maximize your comfort. As you gain motion, your hands will come more underneath your shoulders. Hold this position 15-20 seconds. Slowly return to lying flat on the floor. Repeat 2 times. Complete this exercise 1-2 times per day.   RANGE OF MOTION- Quadruped, Neutral Spine  Assume a hands and knees position on a firm surface. Keep your hands under your shoulders and your knees under your hips. You may place padding under your knees for comfort. Drop your head and point your tailbone toward the ground below you. This will round out your lower back like an angry cat. Hold this position for 15-20 seconds. Slowly lift your head and release your tail bone so that your back sags into a large arch, like an old horse. Hold this position for 15-20 seconds. Repeat this until you feel limber in your low back. Now, find your "sweet spot." This will be the most comfortable position somewhere between the two previous positions. This is your neutral spine. Once you have found this position, tense your stomach muscles to support your low back. Hold this position for  15-20 seconds. Repeat 2 times. Complete this exercise 1-2 times per day.  STRENGTHENING EXERCISES - Low Back Sprain These exercises may help you when beginning to rehabilitate your injury. These exercises should be done near your "sweet spot." This is the neutral, low-back arch, somewhere between fully rounded and fully arched, that is your least painful position. When performed in this safe range of motion, these exercises can be used for people who have either a flexion or extension based injury. These exercises may resolve your symptoms with or without further involvement from your physician, physical therapist or athletic trainer. While completing these exercises, remember:  Muscles can gain both the endurance and the strength needed for everyday activities through controlled exercises. Complete these exercises as instructed by your physician, physical therapist or athletic trainer. Increase the resistance and repetitions only as guided. You may experience muscle soreness or fatigue, but the pain or discomfort you are trying to eliminate should never worsen during these exercises. If this pain does worsen, stop  and make certain you are following the directions exactly. If the pain is still present after adjustments, discontinue the exercise until you can discuss the trouble with your caregiver.  STRENGTHENING - Deep Abdominals, Pelvic Tilt  Lie on a firm bed or floor. Keeping your legs in front of you, bend your knees so they are both pointed toward the ceiling and your feet are flat on the floor. Tense your lower abdominal muscles to press your low back into the floor. This motion will rotate your pelvis so that your tail bone is scooping upwards rather than pointing at your feet or into the floor. With a gentle tension and even breathing, hold this position for 10-15 seconds. Repeat 2 times. Complete this exercise 1 time per day.   STRENGTHENING - Abdominals, Crunches  Lie on a firm bed or  floor. Keeping your legs in front of you, bend your knees so they are both pointed toward the ceiling and your feet are flat on the floor. Cross your arms over your chest. Slightly tip your chin down without bending your neck. Tense your abdominals and slowly lift your trunk high enough to just clear your shoulder blades. Lifting higher can put excessive stress on the lower back and does not further strengthen your abdominal muscles. Control your return to the starting position. Repeat 2 times. Complete this exercise once every 1-2 days.   STRENGTHENING - Quadruped, Opposite UE/LE Lift  Assume a hands and knees position on a firm surface. Keep your hands under your shoulders and your knees under your hips. You may place padding under your knees for comfort. Find your neutral spine and gently tense your abdominal muscles so that you can maintain this position. Your shoulders and hips should form a rectangle that is parallel with the floor and is not twisted. Keeping your trunk steady, lift your right hand no higher than your shoulder and then your left leg no higher than your hip. Make sure you are not holding your breath. Hold this position for 15-20 seconds. Continuing to keep your abdominal muscles tense and your back steady, slowly return to your starting position. Repeat with the opposite arm and leg. Repeat 2 times. Complete this exercise once every 1-2 days.   STRENGTHENING - Abdominals and Quadriceps, Straight Leg Raise  Lie on a firm bed or floor with both legs extended in front of you. Keeping one leg in contact with the floor, bend the other knee so that your foot can rest flat on the floor. Find your neutral spine, and tense your abdominal muscles to maintain your spinal position throughout the exercise. Slowly lift your straight leg off the floor about 6 inches for a count of 15, making sure to not hold your breath. Still keeping your neutral spine, slowly lower your leg all the way to  the floor. Repeat this exercise with each leg 2 times. Complete this exercise once every 1-2 days. POSTURE AND BODY MECHANICS CONSIDERATIONS - Low Back Sprain Keeping correct posture when sitting, standing or completing your activities will reduce the stress put on different body tissues, allowing injured tissues a chance to heal and limiting painful experiences. The following are general guidelines for improved posture. Your physician or physical therapist will provide you with any instructions specific to your needs. While reading these guidelines, remember: The exercises prescribed by your provider will help you have the flexibility and strength to maintain correct postures. The correct posture provides the best environment for your joints to work. All  of your joints have less wear and tear when properly supported by a spine with good posture. This means you will experience a healthier, less painful body. Correct posture must be practiced with all of your activities, especially prolonged sitting and standing. Correct posture is as important when doing repetitive low-stress activities (typing) as it is when doing a single heavy-load activity (lifting).  RESTING POSITIONS Consider which positions are most painful for you when choosing a resting position. If you have pain with flexion-based activities (sitting, bending, stooping, squatting), choose a position that allows you to rest in a less flexed posture. You would want to avoid curling into a fetal position on your side. If your pain worsens with extension-based activities (prolonged standing, working overhead), avoid resting in an extended position such as sleeping on your stomach. Most people will find more comfort when they rest with their spine in a more neutral position, neither too rounded nor too arched. Lying on a non-sagging bed on your side with a pillow between your knees, or on your back with a pillow under your knees will often provide some  relief. Keep in mind, being in any one position for a prolonged period of time, no matter how correct your posture, can still lead to stiffness. PROPER SITTING POSTURE In order to minimize stress and discomfort on your spine, you must sit with correct posture. Sitting with good posture should be effortless for a healthy body. Returning to good posture is a gradual process. Many people can work toward this most comfortably by using various supports until they have the flexibility and strength to maintain this posture on their own. When sitting with proper posture, your ears will fall over your shoulders and your shoulders will fall over your hips. You should use the back of the chair to support your upper back. Your lower back will be in a neutral position, just slightly arched. You may place a small pillow or folded towel at the base of your lower back for  support.  When working at a desk, create an environment that supports good, upright posture. Without extra support, muscles tire, which leads to excessive strain on joints and other tissues. Keep these recommendations in mind:  CHAIR: A chair should be able to slide under your desk when your back makes contact with the back of the chair. This allows you to work closely. The chair's height should allow your eyes to be level with the upper part of your monitor and your hands to be slightly lower than your elbows.  BODY POSITION Your feet should make contact with the floor. If this is not possible, use a foot rest. Keep your ears over your shoulders. This will reduce stress on your neck and low back.  INCORRECT SITTING POSTURES  If you are feeling tired and unable to assume a healthy sitting posture, do not slouch or slump. This puts excessive strain on your back tissues, causing more damage and pain. Healthier options include: Using more support, like a lumbar pillow. Switching tasks to something that requires you to be upright or  walking. Talking a brief walk. Lying down to rest in a neutral-spine position.  PROLONGED STANDING WHILE SLIGHTLY LEANING FORWARD  When completing a task that requires you to lean forward while standing in one place for a long time, place either foot up on a stationary 2-4 inch high object to help maintain the best posture. When both feet are on the ground, the lower back tends to lose its  slight inward curve. If this curve flattens (or becomes too large), then the back and your other joints will experience too much stress, tire more quickly, and can cause pain.  CORRECT STANDING POSTURES Proper standing posture should be assumed with all daily activities, even if they only take a few moments, like when brushing your teeth. As in sitting, your ears should fall over your shoulders and your shoulders should fall over your hips. You should keep a slight tension in your abdominal muscles to brace your spine. Your tailbone should point down to the ground, not behind your body, resulting in an over-extended swayback posture.   INCORRECT STANDING POSTURES  Common incorrect standing postures include a forward head, locked knees and/or an excessive swayback. WALKING Walk with an upright posture. Your ears, shoulders and hips should all line-up.  PROLONGED ACTIVITY IN A FLEXED POSITION When completing a task that requires you to bend forward at your waist or lean over a low surface, try to find a way to stabilize 3 out of 4 of your limbs. You can place a hand or elbow on your thigh or rest a knee on the surface you are reaching across. This will provide you more stability, so that your muscles do not tire as quickly. By keeping your knees relaxed, or slightly bent, you will also reduce stress across your lower back. CORRECT LIFTING TECHNIQUES  DO : Assume a wide stance. This will provide you more stability and the opportunity to get as close as possible to the object which you are lifting. Tense your  abdominals to brace your spine. Bend at the knees and hips. Keeping your back locked in a neutral-spine position, lift using your leg muscles. Lift with your legs, keeping your back straight. Test the weight of unknown objects before attempting to lift them. Try to keep your elbows locked down at your sides in order get the best strength from your shoulders when carrying an object.   Always ask for help when lifting heavy or awkward objects. INCORRECT LIFTING TECHNIQUES DO NOT:  Lock your knees when lifting, even if it is a small object. Bend and twist. Pivot at your feet or move your feet when needing to change directions. Assume that you can safely pick up even a paperclip without proper posture.

## 2017-09-12 NOTE — ED Triage Notes (Signed)
Pt c/o lower back pain onset 2 months ++ .... sts she was seen here on 8/14 for similar sx but did not get an x-ray  Pt would like an xray.... Denies inj/trauma  Pain increases w/activity.... Pain radiates down the right leg  A&O x4... NAD... Ambulatory

## 2017-09-12 NOTE — ED Provider Notes (Signed)
[facility]    CSN: [id number] Arrival date & time: 09/12/17  0865     History   Chief Complaint Chief Complaint  Patient presents with  . Back Pain    HPI Christy Allen is a 41 y.o. female.   HPI  Pt has 2 mo hx of b/l low back pain. Tx'd with muscle relaxant in Aug, got better and is about 50% better, but still having issue. She is asking for XR. No injury. Told to stretch hamstrings. No neurologic s/s's. Radiation to R thigh. Ibuprofen helpful.  Past Medical History:  Diagnosis Date  . ASCUS of cervix with negative high risk HPV 01/2017   Colposcopy adequate normal. ECC negative   History reviewed. No pertinent surgical history.  OB History    Gravida Para Term Preterm AB Living   0 1 3   SAB TAB Ectopic Multiple Live Births   0 1 0 0         Home Medications    Prior to Admission medications   Medication Sig Start Date End Date Taking? Authorizing Provider  Cyanocobalamin (VITAMIN B12 PO) Take 1 tablet by mouth daily.     [provider]  cyclobenzaprine (FLEXERIL) 10 MG tablet Take 1 tablet (10 mg total) by mouth 3 (three) times daily as needed for muscle spasms. 09/12/17   Sharlene Dory, DO  Multiple Vitamin (MULTIVITAMIN WITH MINERALS) TABS tablet Take 1 tablet by mouth daily.    [provider]  naproxen (EC NAPROSYN) 500 MG EC tablet Take 1 tablet (500 mg total) by mouth 2 (two) times daily with a meal. 09/12/17   Jacksen Isip, Jilda Roche, DO    Family History History reviewed. No pertinent family history.  Social History Social History  Substance Use Topics  . Smoking status: Never Smoker  . Smokeless tobacco: Never Used  . Alcohol use No     Allergies   Patient has no known allergies.   Review of Systems Review of Systems  Musculoskeletal: Positive for back pain.  Neurological: Negative for weakness and numbness.     Physical Exam Triage Vital Signs ED Triage Vitals [09/12/17 1026]  Enc  Vitals Group     BP 105/75     Pulse Rate 68     Resp 16     Temp 98.3 F (36.8 C)     Temp Source Oral     SpO2 98 %     Weight 124 lb (56.2 kg)     Height  (1.676 m)   Updated Vital Signs BP 105/75 (BP Location: Left Arm)   Pulse 68   Temp 98.3 F (36.8 C) (Oral)   Resp 16   Ht  (1.676 m)   Wt 124 lb (56.2 kg)   LMP 09/08/2017   SpO2 98%   BMI 20.01 kg/m   Physical Exam  Constitutional: She is oriented to person, place, and time. She appears well-developed and well-nourished.  Pulmonary/Chest: No respiratory distress.  Musculoskeletal:  +TTP over lumbar paraspinal msk and erector spinae groiup b/l, worse on R; 5/5 strength throughout  Neurological: She is alert and oriented to person, place, and time. She displays normal reflexes.  Skin: She is not diaphoretic.     UC Treatments / Results  Procedures Procedures none  Initial Impression / Assessment and Plan / UC Course  I have reviewed the triage vital signs and the nursing notes.  Pertinent labs & imaging results that were available  during my care of the patient were reviewed by me and considered in my medical decision making (see chart for details).     Pt presents with improving, but not resolved, LBP. Tx'd before, no stretching/exercises other than hamstrings. No red flag s/s's on exam or in hx. Will refill conservative meds and have her do more extensive stretches and exercises. Heat, Tylenol prn. F/u with PCP if symptoms fail to improve, seek care if worsening. Pt voiced understanding and agreement to the plan.   Final Clinical Impressions(s) / UC Diagnoses   Final diagnoses:  Bilateral low back pain without sciatica, unspecified chronicity    New Prescriptions New Prescriptions   CYCLOBENZAPRINE (FLEXERIL) 10 MG TABLET    Take 1 tablet (10 mg total) by mouth 3 (three) times daily as needed for muscle spasms.   NAPROXEN (EC NAPROSYN) 500 MG EC TABLET    Take 1 tablet (500 mg total) by mouth 2  (two) times daily with a meal.     Controlled Substance Prescriptions [place] Controlled Substance Registry consulted? Not Applicable   Sharlene Dory, DO 09/12/17 1200

## 2017-12-05 HISTORY — PX: FOOT SURGERY: SHX648

## 2018-01-15 ENCOUNTER — Encounter: Payer: 59 | Admitting: Women's Health

## 2018-01-15 DIAGNOSIS — Z0289 Encounter for other administrative examinations: Secondary | ICD-10-CM

## 2018-01-17 ENCOUNTER — Other Ambulatory Visit: Payer: Self-pay | Admitting: Women's Health

## 2018-01-17 DIAGNOSIS — B373 Candidiasis of vulva and vagina: Secondary | ICD-10-CM

## 2018-01-17 DIAGNOSIS — B3731 Acute candidiasis of vulva and vagina: Secondary | ICD-10-CM

## 2018-01-17 NOTE — Telephone Encounter (Signed)
Okay for refill, Diflucan 100 mg 2 tablets when necessary, #30 no refills. I think she is also due for an annual exam. She has a history of recurrent yeast

## 2018-02-05 ENCOUNTER — Other Ambulatory Visit: Payer: Self-pay | Admitting: Women's Health

## 2018-02-05 DIAGNOSIS — B3731 Acute candidiasis of vulva and vagina: Secondary | ICD-10-CM

## 2018-02-05 DIAGNOSIS — B373 Candidiasis of vulva and vagina: Secondary | ICD-10-CM

## 2018-04-30 ENCOUNTER — Emergency Department (HOSPITAL_COMMUNITY)
Admission: EM | Admit: 2018-04-30 | Discharge: 2018-04-30 | Disposition: A | Discharge status: Home / Self Care | Payer: 59 | Attending: Emergency Medicine | Admitting: Emergency Medicine

## 2018-04-30 ENCOUNTER — Encounter (HOSPITAL_COMMUNITY): Payer: Self-pay | Admitting: Emergency Medicine

## 2018-04-30 DIAGNOSIS — R1010 Upper abdominal pain, unspecified: Secondary | ICD-10-CM | POA: Insufficient documentation

## 2018-04-30 DIAGNOSIS — Z79899 Other long term (current) drug therapy: Secondary | ICD-10-CM | POA: Diagnosis not present

## 2018-04-30 LAB — CBC
HEMATOCRIT: 36.1 % (ref 36.0–46.0)
Hemoglobin: 11.4 g/dL — ABNORMAL LOW (ref 12.0–15.0)
MCH: 26.1 pg (ref 26.0–34.0)
MCHC: 31.6 g/dL (ref 30.0–36.0)
MCV: 82.8 fL (ref 78.0–100.0)
Platelets: 283 10*3/uL (ref 150–400)
RBC: 4.36 MIL/uL (ref 3.87–5.11)
RDW: 13.7 % (ref 11.5–15.5)
WBC: 4.4 10*3/uL (ref 4.0–10.5)

## 2018-04-30 LAB — COMPREHENSIVE METABOLIC PANEL
ALK PHOS: 41 U/L (ref 38–126)
ALT: 8 U/L — ABNORMAL LOW (ref 14–54)
ANION GAP: 10 (ref 5–15)
AST: 14 U/L — ABNORMAL LOW (ref 15–41)
Albumin: 3.6 g/dL (ref 3.5–5.0)
BILIRUBIN TOTAL: 0.6 mg/dL (ref 0.3–1.2)
BUN: 8 mg/dL (ref 6–20)
CHLORIDE: 105 mmol/L (ref 101–111)
CO2: 23 mmol/L (ref 22–32)
Calcium: 9 mg/dL (ref 8.9–10.3)
Creatinine, Ser: 0.88 mg/dL (ref 0.44–1.00)
Glucose, Bld: 97 mg/dL (ref 65–99)
POTASSIUM: 3.5 mmol/L (ref 3.5–5.1)
Sodium: 138 mmol/L (ref 135–145)
Total Protein: 7.3 g/dL (ref 6.5–8.1)

## 2018-04-30 LAB — I-STAT BETA HCG BLOOD, ED (MC, WL, AP ONLY)

## 2018-04-30 LAB — URINALYSIS, ROUTINE W REFLEX MICROSCOPIC
BILIRUBIN URINE: NEGATIVE
Glucose, UA: NEGATIVE mg/dL
Hgb urine dipstick: NEGATIVE
KETONES UR: NEGATIVE mg/dL
Nitrite: NEGATIVE
Protein, ur: NEGATIVE mg/dL
Specific Gravity, Urine: 1.024 (ref 1.005–1.030)
pH: 6 (ref 5.0–8.0)

## 2018-04-30 LAB — LIPASE, BLOOD: Lipase: 40 U/L (ref 11–51)

## 2018-04-30 NOTE — ED Provider Notes (Signed)
[facility] EMERGENCY DEPARTMENT Provider Note   CSN: [id number] Arrival date & time: 04/30/18  0545     History   Chief Complaint Chief Complaint  Patient presents with  . Abdominal Pain    HPI Christy Allen is a 42 y.o. female.  HPI   42 year old female presenting for evaluation of abdominal pain.  Patient mention for the past 3 days she experienced intermittent episodes of pain in her abdomen.  Pain is described as a short jolting pain mild to moderate in severity, happens sporadically and feels similar to pain that she fell before she have bowel movement.  She could not locate the pain.  There is no associated fever, chills, lightheadedness, dizziness, chest pain, shortness of breath, productive cough, back pain, dysuria, hematuria, vaginal bleeding or vaginal discharge.  No recent strenuous activities or heavy lifting.  She mentioned normally having a bowel movement once every 6 to 7 days.  However she recently started to take MiraLAX, and Colace per recommendation of her gastroenterologist to help with a bowel movement and was unsure if this may contribute to it.  She did notice that her stool has been much more loose since she is on the new medication.  Patient reports concern for potential cancer, however denies tobacco or alcohol abuse, no strong family history of cancer, no abnormal weight changes, night sweats, or fever.  Pain is minimal at this time.  Past Medical History:  Diagnosis Date  . ASCUS of cervix with negative high risk HPV 01/2017   Colposcopy adequate normal. ECC negative    There are no active problems to display for this patient.   History reviewed. No pertinent surgical history.   OB History    Gravida  4   Para  3   Term  3   Preterm  0   AB  1   Living  3     SAB  0   TAB  1   Ectopic  0   Multiple  0   Live Births               Home Medications    Prior to Admission medications   Medication Sig Start  Date End Date Taking? Authorizing Provider  Cyanocobalamin (VITAMIN B12 PO) Take 1 tablet by mouth daily.     [provider]  cyclobenzaprine (FLEXERIL) 10 MG tablet Take 1 tablet (10 mg total) by mouth 3 (three) times daily as needed for muscle spasms. 09/12/17   Sharlene Dory, DO  fluconazole (DIFLUCAN) 100 MG tablet TAKE 2 TABLETS BY MOUTH AS NEEDED 01/17/18   Harrington Challenger, NP  fluconazole (DIFLUCAN) 100 MG tablet TAKE 2 TABLETS BY MOUTH AS NEEDED 01/17/18   Harrington Challenger, NP  Multiple Vitamin (MULTIVITAMIN WITH MINERALS) TABS tablet Take 1 tablet by mouth daily.    [provider]  naproxen (EC NAPROSYN) 500 MG EC tablet Take 1 tablet (500 mg total) by mouth 2 (two) times daily with a meal. 09/12/17   Wendling, Jilda Roche, DO    Family History No family history on file.  Social History Social History   Tobacco Use  . Smoking status: Never Smoker  . Smokeless tobacco: Never Used  Substance Use Topics  . Alcohol use: No    Alcohol/week: 0.0 oz  . Drug use: No     Allergies   Patient has no known allergies.   Review of Systems Review of Systems  All other systems reviewed  and are negative.    Physical Exam Updated Vital Signs BP 117/74 (BP Location: Right Arm)   Pulse (!) 57   Temp 98.2 F (36.8 C) (Oral)   Resp 18   Wt 59.9 kg (132 lb)   LMP 04/16/2018   SpO2 100%   BMI 21.31 kg/m   Physical Exam  Constitutional: She appears well-developed and well-nourished. No distress.  HENT:  Head: Atraumatic.  Eyes: Conjunctivae are normal.  Neck: Neck supple.  Cardiovascular: Normal rate and regular rhythm.  Pulmonary/Chest: Breath sounds normal.  Abdominal: Normal appearance and bowel sounds are normal. There is no tenderness.  Neurological: She is alert.  Skin: No rash noted.  Psychiatric: She has a normal mood and affect.  Nursing note and vitals reviewed.    ED Treatments / Results  Labs (all labs ordered are listed, but only  abnormal results are displayed) Labs Reviewed  COMPREHENSIVE METABOLIC PANEL - Abnormal; Notable for the following components:      Result Value   AST 14 (*)    ALT 8 (*)    All other components within normal limits  CBC - Abnormal; Notable for the following components:   Hemoglobin 11.4 (*)    All other components within normal limits  URINALYSIS, ROUTINE W REFLEX MICROSCOPIC - Abnormal; Notable for the following components:   APPearance CLOUDY (*)    Leukocytes, UA SMALL (*)    Bacteria, UA RARE (*)    All other components within normal limits  LIPASE, BLOOD  I-STAT BETA HCG BLOOD, ED ([facility], [facility], [facility] ONLY)    EKG None  Radiology No results found.  Procedures Procedures (including critical care time)  Medications Ordered in ED Medications - No data to display   Initial Impression / Assessment and Plan / ED Course  I have reviewed the triage vital signs and the nursing notes.  Pertinent labs & imaging results that were available during my care of the patient were reviewed by me and considered in my medical decision making (see chart for details).     BP 117/74 (BP Location: Right Arm)   Pulse (!) 57   Temp 98.2 F (36.8 C) (Oral)   Resp 18   Wt 59.9 kg (132 lb)   LMP 04/16/2018   SpO2 100%   BMI 21.31 kg/m    Final Clinical Impressions(s) / ED Diagnoses   Final diagnoses:  Pain of upper abdomen    ED Discharge Orders    None     8:34 AM Patient report intermittent abdominal discomfort.  No active pain at this time.  States that she searches [employer] for her symptoms and was concern for potential cancer.  However, patient does not have any significant risk factor for cancer at this time.  No significant abdominal discomfort on exam.  Labs are reassuring.  I encourage patient to follow-up closely with her PCP and with her gastroenterologist for further management of her condition.  Return precautions discussed.  All questions answered to patient satisfaction.     Fayrene Helper, PA-C 04/30/18 1610    Melene Plan, DO 04/30/18 9604

## 2018-04-30 NOTE — ED Triage Notes (Signed)
Pt c/o mid abd pain x 3 days, denies n/v/d. Pt states she recently started taking Colace and has had a BM every day x 3 days, normal BM every 7 days.

## 2018-05-02 ENCOUNTER — Ambulatory Visit (INDEPENDENT_AMBULATORY_CARE_PROVIDER_SITE_OTHER): Payer: 59 | Admitting: Women's Health

## 2018-05-02 ENCOUNTER — Encounter: Payer: Self-pay | Admitting: Women's Health

## 2018-05-02 VITALS — BP 100/78 | Ht 66.0 in | Wt 132.0 lb

## 2018-05-02 DIAGNOSIS — B373 Candidiasis of vulva and vagina: Secondary | ICD-10-CM | POA: Diagnosis not present

## 2018-05-02 DIAGNOSIS — N852 Hypertrophy of uterus: Secondary | ICD-10-CM | POA: Diagnosis not present

## 2018-05-02 DIAGNOSIS — Z01419 Encounter for gynecological examination (general) (routine) without abnormal findings: Secondary | ICD-10-CM

## 2018-05-02 DIAGNOSIS — B3731 Acute candidiasis of vulva and vagina: Secondary | ICD-10-CM

## 2018-05-02 MED ORDER — FLUCONAZOLE 100 MG PO TABS: ORAL_TABLET | ORAL | 0 refills | Status: DC

## 2018-05-02 NOTE — Progress Notes (Signed)
lab

## 2018-05-02 NOTE — Progress Notes (Signed)
Christy Allen [birth year] [id number]    History:    Presents for annual exam.  Regular monthly cycle, using no contraception but has not conceived in greater than 7 years.  Pap ASCUS with positive HR HPV with negative colposcopy 02/2017.  Has some breast tenderness mostly prior to cycle.  Has had some problems with chronic constipation.  Was seen in the ER last week for low abdominal pain no problem was noted, normal CBC, CMP.  Past medical history, past surgical history, family history and social history were all reviewed and documented in the EPIC chart.  Works at  [employer].  originally from [place].  40 year old daughter causing some havoc in the house.  Children are ages 40, 75 and 74 also has 3 stepsons and 1 stepdaughter.  Stepchildren are there every other weekend.  Parents healthy live in [place].   ROS:  A ROS was performed and pertinent positives and negatives are included.  Exam:  Vitals:   05/02/18 0832  BP: 100/78  Weight: 132 lb (59.9 kg)  Height:  (1.676 m)   Body mass index is 21.31 kg/m.   General appearance:  Normal Thyroid:  Symmetrical, normal in size, without palpable masses or nodularity. Respiratory  Auscultation:  Clear without wheezing or rhonchi Cardiovascular  Auscultation:  Regular rate, without rubs, murmurs or gallops  Edema/varicosities:  Not grossly evident Abdominal  Soft,nontender, without masses, guarding or rebound.  Liver/spleen:  No organomegaly noted  Hernia:  None appreciated  Skin  Inspection:  Grossly normal   Breasts: Examined lying and sitting.     Right: Without masses, retractions, discharge or axillary adenopathy.     Left: Without masses, retractions, discharge or axillary adenopathy. Gentitourinary   Inguinal/mons:  Normal without inguinal adenopathy  External genitalia:  Normal  BUS/Urethra/Skene's glands:  Normal  Vagina:  Normal  Cervix:  Normal  Uterus: Enlarged, questionable fibroids.  Midline and  mobile  Adnexa/parametria:     Rt: Without masses or tenderness.   Lt: Without masses or tenderness.  Anus and perineum: Normal  Digital rectal exam: Normal sphincter tone without palpated masses or tenderness  Assessment/Plan:  42 y.o. SBF G4 P3 +4 stepchildren for annual exam with no GYN complaints.  Monthly cycle/no contraception/pregnancy okay History of chronic constipation Situational stress numerous teenagers 2018 ASCUS with positive HR HPV with negative colposcopy  Plan: Uterus palpates enlarged 8 to 10 weeks size, ultrasound.  Will schedule.  Encouraged to continue with counseling. SBE's, reviewed normality of cyclic breast tenderness related to hormonal changes, start annual screening mammogram September at age 42.  Exercise, calcium rich foods, vitamin D 1000 daily encouraged.  Continue with fiber rich foods, MiraLAX as needed.  Contraception options reviewed and declined states is not conceived and feels like she cannot conceive.  Will check fasting lipid panel next year.  Pap.      Harrington Challenger [facility], 10:00 AM 05/02/2018

## 2018-05-02 NOTE — Patient Instructions (Signed)
Breast center  [phone]  [facility] [address]   Health Maintenance, Female Adopting a healthy lifestyle and getting preventive care can go a long way to promote health and wellness. Talk with your health care provider about what schedule of regular examinations is right for you. This is a good chance for you to check in with your provider about disease prevention and staying healthy. In between checkups, there are plenty of things you can do on your own. Experts have done a lot of research about which lifestyle changes and preventive measures are most likely to keep you healthy. Ask your health care provider for more information. Weight and diet Eat a healthy diet  Be sure to include plenty of vegetables, fruits, low-fat dairy products, and lean protein.  Do not eat a lot of foods high in solid fats, added sugars, or salt.  Get regular exercise. This is one of the most important things you can do for your health. ? Most adults should exercise for at least 150 minutes each week. The exercise should increase your heart rate and make you sweat (moderate-intensity exercise). ? Most adults should also do strengthening exercises at least twice a week. This is in addition to the moderate-intensity exercise.  Maintain a healthy weight  Body mass index (BMI) is a measurement that can be used to identify possible weight problems. It estimates body fat based on height and weight. Your health care provider can help determine your BMI and help you achieve or maintain a healthy weight.  For females 58 years of age and older: ? A BMI below 18.5 is considered underweight. ? A BMI of 18.5 to 24.9 is normal. ? A BMI of 25 to 29.9 is considered overweight. ? A BMI of 30 and above is considered obese.  Watch levels of cholesterol and blood lipids  You should start having your blood tested for lipids and cholesterol at 40 years of age, then have this test every 5 years.  You may need to have your  cholesterol levels checked more often if: ? Your lipid or cholesterol levels are high. ? You are older than 40 years of age. ? You are at high risk for heart disease.  Cancer screening Lung Cancer  Lung cancer screening is recommended for adults 43-67 years old who are at high risk for lung cancer because of a history of smoking.  A yearly low-dose CT scan of the lungs is recommended for people who: ? Currently smoke. ? Have quit within the past 15 years. ? Have at least a 30-pack-year history of smoking. A pack year is smoking an average of one pack of cigarettes a day for 1 year.  Yearly screening should continue until it has been 15 years since you quit.  Yearly screening should stop if you develop a health problem that would prevent you from having lung cancer treatment.  Breast Cancer  Practice breast self-awareness. This means understanding how your breasts normally appear and feel.  It also means doing regular breast self-exams. Let your health care provider know about any changes, no matter how small.  If you are in your 20s or 30s, you should have a clinical breast exam (CBE) by a health care provider every 1-3 years as part of a regular health exam.  If you are 46 or older, have a CBE every year. Also consider having a breast X-ray (mammogram) every year.  If you have a family history of breast cancer, talk to your health  provider about genetic screening.  If you are at high risk for breast cancer, talk to your health care provider about having an MRI and a mammogram every year.  Breast cancer gene (BRCA) assessment is recommended for women who have family members with BRCA-related cancers. BRCA-related cancers include: ? Breast. ? Ovarian. ? Tubal. ? Peritoneal cancers.  Results of the assessment will determine the need for genetic counseling and BRCA1 and BRCA2 testing.  Cervical Cancer Your health care provider may recommend that you be screened regularly for cancer of  the pelvic organs (ovaries, uterus, and vagina). This screening involves a pelvic examination, including checking for microscopic changes to the surface of your cervix (Pap test). You may be encouraged to have this screening done every 3 years, beginning at age 21.  For women ages 30-65, health care providers may recommend pelvic exams and Pap testing every 3 years, or they may recommend the Pap and pelvic exam, combined with testing for human papilloma virus (HPV), every 5 years. Some types of HPV increase your risk of cervical cancer. Testing for HPV may also be done on women of any age with unclear Pap test results.  Other health care providers may not recommend any screening for nonpregnant women who are considered low risk for pelvic cancer and who do not have symptoms. Ask your health care provider if a screening pelvic exam is right for you.  If you have had past treatment for cervical cancer or a condition that could lead to cancer, you need Pap tests and screening for cancer for at least 20 years after your treatment. If Pap tests have been discontinued, your risk factors (such as having a new sexual partner) need to be reassessed to determine if screening should resume. Some women have medical problems that increase the chance of getting cervical cancer. In these cases, your health care provider may recommend more frequent screening and Pap tests.  Colorectal Cancer  This type of cancer can be detected and often prevented.  Routine colorectal cancer screening usually begins at 40 years of age and continues through 40 years of age.  Your health care provider may recommend screening at an earlier age if you have risk factors for colon cancer.  Your health care provider may also recommend using home test kits to check for hidden blood in the stool.  A small camera at the end of a tube can be used to examine your colon directly (sigmoidoscopy or colonoscopy). This is done to check for the  earliest forms of colorectal cancer.  Routine screening usually begins at age 50.  Direct examination of the colon should be repeated every 5-10 years through 40 years of age. However, you may need to be screened more often if early forms of precancerous polyps or small growths are found.  Skin Cancer  Check your skin from head to toe regularly.  Tell your health care provider about any new moles or changes in moles, especially if there is a change in a mole's shape or color.  Also tell your health care provider if you have a mole that is larger than the size of a pencil eraser.  Always use sunscreen. Apply sunscreen liberally and repeatedly throughout the day.  Protect yourself by wearing long sleeves, pants, a wide-brimmed hat, and sunglasses whenever you are outside.  Heart disease, diabetes, and high blood pressure  High blood pressure causes heart disease and increases the risk of stroke. High blood pressure is more likely to develop in: ?   People who have blood pressure in the high end of the normal range (130-139/85-89 mm Hg). ? People who are overweight or obese. ? People who are African American.  If you are 18-39 years of age, have your blood pressure checked every 3-5 years. If you are 40 years of age or older, have your blood pressure checked every year. You should have your blood pressure measured twice-once when you are at a hospital or clinic, and once when you are not at a hospital or clinic. Record the average of the two measurements. To check your blood pressure when you are not at a hospital or clinic, you can use: ? An automated blood pressure machine at a pharmacy. ? A home blood pressure monitor.  If you are between 55 years and 79 years old, ask your health care provider if you should take aspirin to prevent strokes.  Have regular diabetes screenings. This involves taking a blood sample to check your fasting blood sugar level. ? If you are at a normal weight and  have a low risk for diabetes, have this test once every three years after 40 years of age. ? If you are overweight and have a high risk for diabetes, consider being tested at a younger age or more often. Preventing infection Hepatitis B  If you have a higher risk for hepatitis B, you should be screened for this virus. You are considered at high risk for hepatitis B if: ? You were born in a country where hepatitis B is common. Ask your health care provider which countries are considered high risk. ? Your parents were born in a high-risk country, and you have not been immunized against hepatitis B (hepatitis B vaccine). ? You have HIV or AIDS. ? You use needles to inject street drugs. ? You live with someone who has hepatitis B. ? You have had sex with someone who has hepatitis B. ? You get hemodialysis treatment. ? You take certain medicines for conditions, including cancer, organ transplantation, and autoimmune conditions.  Hepatitis C  Blood testing is recommended for: ? Everyone born from 1945 through 1965. ? Anyone with known risk factors for hepatitis C.  Sexually transmitted infections (STIs)  You should be screened for sexually transmitted infections (STIs) including gonorrhea and chlamydia if: ? You are sexually active and are younger than 40 years of age. ? You are older than 40 years of age and your health care provider tells you that you are at risk for this type of infection. ? Your sexual activity has changed since you were last screened and you are at an increased risk for chlamydia or gonorrhea. Ask your health care provider if you are at risk.  If you do not have HIV, but are at risk, it may be recommended that you take a prescription medicine daily to prevent HIV infection. This is called pre-exposure prophylaxis (PrEP). You are considered at risk if: ? You are sexually active and do not regularly use condoms or know the HIV status of your partner(s). ? You take drugs by  injection. ? You are sexually active with a partner who has HIV.  Talk with your health care provider about whether you are at high risk of being infected with HIV. If you choose to begin PrEP, you should first be tested for HIV. You should then be tested every 3 months for as long as you are taking PrEP. Pregnancy  If you are premenopausal and you may become pregnant, ask your health   your health care provider about preconception counseling.  If you may become pregnant, take 400 to 800 micrograms (mcg) of folic acid every day.  If you want to prevent pregnancy, talk to your health care provider about birth control (contraception). Osteoporosis and menopause  Osteoporosis is a disease in which the bones lose minerals and strength with aging. This can result in serious bone fractures. Your risk for osteoporosis can be identified using a bone density scan.  If you are 28 years of age or older, or if you are at risk for osteoporosis and fractures, ask your health care provider if you should be screened.  Ask your health care provider whether you should take a calcium or vitamin D supplement to lower your risk for osteoporosis.  Menopause may have certain physical symptoms and risks.  Hormone replacement therapy may reduce some of these symptoms and risks. Talk to your health care provider about whether hormone replacement therapy is right for you. Follow these instructions at home:  Schedule regular health, dental, and eye exams.  Stay current with your immunizations.  Do not use any tobacco products including cigarettes, chewing tobacco, or electronic cigarettes.  If you are pregnant, do not drink alcohol.  If you are breastfeeding, limit how much and how often you drink alcohol.  Limit alcohol intake to no more than 1 drink per day for nonpregnant women. One drink equals 12 ounces of beer, 5 ounces of wine, or 1 ounces of hard liquor.  Do not use street  drugs.  Do not share needles.  Ask your health care provider for help if you need support or information about quitting drugs.  Tell your health care provider if you often feel depressed.  Tell your health care provider if you have ever been abused or do not feel safe at home. This information is not intended to replace advice given to you by your health care provider. Make sure you discuss any questions you have with your health care provider. Document Released: 06/06/2011 Document Revised: 04/28/2016 Document Reviewed: 08/25/2015 Elsevier Interactive Patient Education  [employer].

## 2018-05-03 LAB — PAP IG W/ RFLX HPV ASCU

## 2018-05-04 LAB — URINALYSIS, COMPLETE W/RFL CULTURE
Bilirubin Urine: NEGATIVE
Glucose, UA: NEGATIVE
HGB URINE DIPSTICK: NEGATIVE
Hyaline Cast: NONE SEEN /LPF
KETONES UR: NEGATIVE
Nitrites, Initial: NEGATIVE
PH: 7.5 (ref 5.0–8.0)
PROTEIN: NEGATIVE
Specific Gravity, Urine: 1.015 (ref 1.001–1.03)

## 2018-05-04 LAB — URINE CULTURE
MICRO NUMBER:: 90651928
Result:: NO GROWTH
SPECIMEN QUALITY:: ADEQUATE

## 2018-05-04 LAB — CULTURE INDICATED

## 2018-05-16 ENCOUNTER — Other Ambulatory Visit: Payer: 59

## 2018-05-16 ENCOUNTER — Ambulatory Visit: Payer: 59 | Admitting: Women's Health

## 2018-05-30 ENCOUNTER — Ambulatory Visit: Payer: 59 | Admitting: Women's Health

## 2018-05-30 ENCOUNTER — Other Ambulatory Visit: Payer: 59

## 2018-05-30 DIAGNOSIS — Z0289 Encounter for other administrative examinations: Secondary | ICD-10-CM

## 2018-09-24 ENCOUNTER — Other Ambulatory Visit: Payer: Self-pay | Admitting: Women's Health

## 2018-09-24 DIAGNOSIS — Z1231 Encounter for screening mammogram for malignant neoplasm of breast: Secondary | ICD-10-CM

## 2018-10-17 ENCOUNTER — Ambulatory Visit (INDEPENDENT_AMBULATORY_CARE_PROVIDER_SITE_OTHER): Payer: 59

## 2018-10-17 ENCOUNTER — Other Ambulatory Visit: Payer: Self-pay | Admitting: Women's Health

## 2018-10-17 ENCOUNTER — Encounter: Payer: Self-pay | Admitting: Women's Health

## 2018-10-17 ENCOUNTER — Ambulatory Visit (INDEPENDENT_AMBULATORY_CARE_PROVIDER_SITE_OTHER): Payer: 59 | Admitting: Women's Health

## 2018-10-17 VITALS — BP 124/70 | Wt 141.0 lb

## 2018-10-17 DIAGNOSIS — N831 Corpus luteum cyst of ovary, unspecified side: Secondary | ICD-10-CM

## 2018-10-17 DIAGNOSIS — N852 Hypertrophy of uterus: Secondary | ICD-10-CM

## 2018-10-17 NOTE — Progress Notes (Signed)
42 year old MBF G4, P3 presents for ultrasound.  At annual exam uterus questionable enlarged.  Monthly cycle using no contraception, pregnancy okay.  02/2017 ASCUS with positive HR HPV negative colposcopy, 04/2018 Pap LGSIL.  Exam: Appears well.  Ultrasound: T/V uterus anteverted homogeneous.  Endometrium within normal limits Tri layered 6.1 mm.  Right ovary thick-walled corpus luteal cyst 25 x 19 mm.  Positive CFD periphery of right ovary.  Left ovary normal.  Negative cul-de-sac.  No apparent mass right or left adnexal.  Normal GYN ultrasound  Plan: Reviewed uterus slightly enlarged normal multigravida.  Pap LGSIL reviewed importance of repeating colposcopy with Dr. Audie BoxFontaine, states will schedule.

## 2018-10-30 ENCOUNTER — Ambulatory Visit
Admission: RE | Admit: 2018-10-30 | Discharge: 2018-10-30 | Disposition: A | Discharge status: Home / Self Care | Payer: 59 | Source: Ambulatory Visit | Attending: Women's Health | Admitting: Women's Health

## 2018-10-30 DIAGNOSIS — Z1231 Encounter for screening mammogram for malignant neoplasm of breast: Secondary | ICD-10-CM

## 2018-11-20 ENCOUNTER — Ambulatory Visit (INDEPENDENT_AMBULATORY_CARE_PROVIDER_SITE_OTHER): Payer: 59 | Admitting: Gynecology

## 2018-11-20 ENCOUNTER — Encounter: Payer: Self-pay | Admitting: Gynecology

## 2018-11-20 VITALS — BP 118/76

## 2018-11-20 DIAGNOSIS — N870 Mild cervical dysplasia: Secondary | ICD-10-CM

## 2018-11-20 DIAGNOSIS — R87612 Low grade squamous intraepithelial lesion on cytologic smear of cervix (LGSIL): Secondary | ICD-10-CM

## 2018-11-20 NOTE — Addendum Note (Signed)
Addended by: Dara LordsFONTAINE, TIMOTHY P on: 11/20/2018 03:46 PM   Modules accepted: Orders

## 2018-11-20 NOTE — Progress Notes (Signed)
    Christy Allen 12/15/1975 [id number]        42 y.o.  [id number] presents with a history of normal cytology positive high risk HPV negative subtype 16, 18/45 in 2016.  Follow-up Pap smear 2018 showed ASCUS with positive high risk HPV.  Colposcopy at that time was normal with negative ECC.  Most recent Pap smear showed LGSIL.  Past medical history,surgical history, problem list, medications, allergies, family history and social history were all reviewed and documented in the EPIC chart.  Directed ROS with pertinent positives and negatives documented in the history of present illness/assessment and plan.  Exam: Kennon PortelaKim Allen assistant Vitals:   11/20/18 1447  BP: 118/76   General appearance:  Normal Abdomen soft nontender without masses guarding rebound Pelvic external BUS vagina normal.  Cervix grossly normal.  Uterus generous in size midline mobile nontender.  Adnexa without masses or tenderness.  Colposcopy performed after acetic acid cleanse is adequate and normal.  Transformation zone visualized 360 degrees.  ECC performed.  Patient tolerated well.    Physical Exam  Genitourinary:        Assessment/Plan:  42 y.o. [id number] with history of persistent low-grade dysplasia and positive high risk HPV.  Colposcopy normal today.  ECC performed.  If negative or low-grade then plan expectant management with follow-up Pap smear in 1 year.  We again discussed the whole issue of dysplasia, low-grade/high-grade, progression/regression and the HPV association.    Christy Lordsimothy P Dilraj Killgore MD, 3:05 PM 11/20/2018

## 2018-11-20 NOTE — Patient Instructions (Signed)
Office will call with biopsy results 

## 2018-11-22 LAB — TISSUE SPECIMEN

## 2018-11-22 LAB — PATHOLOGY

## 2019-05-06 ENCOUNTER — Other Ambulatory Visit: Payer: Self-pay

## 2019-05-08 ENCOUNTER — Ambulatory Visit (INDEPENDENT_AMBULATORY_CARE_PROVIDER_SITE_OTHER): Payer: 59 | Admitting: Women's Health

## 2019-05-08 ENCOUNTER — Other Ambulatory Visit: Payer: Self-pay

## 2019-05-08 ENCOUNTER — Encounter: Payer: Self-pay | Admitting: Women's Health

## 2019-05-08 VITALS — BP 122/80 | Ht 66.0 in | Wt 143.0 lb

## 2019-05-08 DIAGNOSIS — Z01419 Encounter for gynecological examination (general) (routine) without abnormal findings: Secondary | ICD-10-CM | POA: Diagnosis not present

## 2019-05-08 DIAGNOSIS — Z13220 Encounter for screening for lipoid disorders: Secondary | ICD-10-CM | POA: Diagnosis not present

## 2019-05-08 DIAGNOSIS — R87612 Low grade squamous intraepithelial lesion on cytologic smear of cervix (LGSIL): Secondary | ICD-10-CM | POA: Insufficient documentation

## 2019-05-08 MED ORDER — FLUCONAZOLE 100 MG PO TABS: ORAL_TABLET | ORAL | 0 refills | Status: DC

## 2019-05-08 NOTE — Addendum Note (Signed)
Addended by: Tito Dine on: 05/08/2019 08:56 AM   Modules accepted: Orders

## 2019-05-08 NOTE — Progress Notes (Signed)
Christy Allen 1976-07-31 [id number]    History:    Presents for annual exam.  Monthly cycle using no contraception without pregnancy for greater than 8 years, pregnancy okay.  2018 ASCUS with positive high risk HPV negative colposcopy and biopsy.  11/2018 LGSIL biopsy LGSIL.  History of recurrent yeast, occasional use of Diflucan 100 mg.  Past medical history, past surgical history, family history and social history were all reviewed and documented in the EPIC chart.  Works at [employer].  Originally from [place], parents live in [place] but does have a grandmother local.  Has 3 children ages 81, 52 and 33, +75 stepsons and 1 stepdaughter.  ROS:  A ROS was performed and pertinent positives and negatives are included.  Exam:  Vitals:   05/08/19 0804  Weight: 143 lb (64.9 kg)  Height: 5\' 6"  (1.676 m)   Body mass index is 23.08 kg/m.   General appearance:  Normal Thyroid:  Symmetrical, normal in size, without palpable masses or nodularity. Respiratory  Auscultation:  Clear without wheezing or rhonchi Cardiovascular  Auscultation:  Regular rate, without rubs, murmurs or gallops  Edema/varicosities:  Not grossly evident Abdominal  Soft,nontender, without masses, guarding or rebound.  Liver/spleen:  No organomegaly noted  Hernia:  None appreciated  Skin  Inspection:  Grossly normal   Breasts: Examined lying and sitting.     Right: Without masses, retractions, discharge or axillary adenopathy.     Left: Without masses, retractions, discharge or axillary adenopathy. Gentitourinary   Inguinal/mons:  Normal without inguinal adenopathy  External genitalia:  Normal  BUS/Urethra/Skene's glands:  Normal  Vagina:  Normal  Cervix:  Normal  Uterus:  normal in size, shape and contour.  Midline and mobile  Adnexa/parametria:     Rt: Without masses or tenderness.   Lt: Without masses or tenderness.  Anus and perineum: Normal  Digital rectal exam: Normal sphincter tone without palpated  masses or tenderness  Assessment/Plan:  43 y.o. MBF G4, P3 +4 stepchildren for annual exam with no complaints.  Regular monthly cycle/no contraception/pregnancy okay 11/2018 LGSIL confirmed on biopsy  Plan: Pap, we will triage based on results.  Contraception options reviewed, declines.  SBEs, continue annual screening mammogram had negative/normal mammogram 10/2018.  Encouraged regular exercise, calcium rich foods, vitamin D 1000 daily.  Diflucan 100 mg #30 uses rarely history of recurrent yeast has had no problems in the past year.  CBC, CMP, lipid panel,     Harrington Challenger [facility], 8:05 AM 05/08/2019

## 2019-05-08 NOTE — Patient Instructions (Signed)

## 2019-05-09 LAB — CBC WITH DIFFERENTIAL/PLATELET
Absolute Monocytes: 496 cells/uL (ref 200–950)
Basophils Absolute: 50 cells/uL (ref 0–200)
Basophils Relative: 1.2 %
Eosinophils Absolute: 315 cells/uL (ref 15–500)
Eosinophils Relative: 7.5 %
HCT: 37.5 % (ref 35.0–45.0)
Hemoglobin: 12.3 g/dL (ref 11.7–15.5)
Lymphs Abs: 1714 cells/uL (ref 850–3900)
MCH: 27.2 pg (ref 27.0–33.0)
MCHC: 32.8 g/dL (ref 32.0–36.0)
MCV: 82.8 fL (ref 80.0–100.0)
MPV: 10.2 fL (ref 7.5–12.5)
Monocytes Relative: 11.8 %
Neutro Abs: 1625 cells/uL (ref 1500–7800)
Neutrophils Relative %: 38.7 %
Platelets: 292 10*3/uL (ref 140–400)
RBC: 4.53 10*6/uL (ref 3.80–5.10)
RDW: 13.2 % (ref 11.0–15.0)
Total Lymphocyte: 40.8 %
WBC: 4.2 10*3/uL (ref 3.8–10.8)

## 2019-05-09 LAB — COMPREHENSIVE METABOLIC PANEL
AG Ratio: 1.2 (calc) (ref 1.0–2.5)
ALT: 6 U/L (ref 6–29)
AST: 11 U/L (ref 10–30)
Albumin: 4.2 g/dL (ref 3.6–5.1)
Alkaline phosphatase (APISO): 44 U/L (ref 31–125)
BUN: 12 mg/dL (ref 7–25)
CO2: 26 mmol/L (ref 20–32)
Calcium: 9.3 mg/dL (ref 8.6–10.2)
Chloride: 103 mmol/L (ref 98–110)
Creat: 0.98 mg/dL (ref 0.50–1.10)
Globulin: 3.6 g/dL (calc) (ref 1.9–3.7)
Glucose, Bld: 94 mg/dL (ref 65–99)
Potassium: 4 mmol/L (ref 3.5–5.3)
Sodium: 137 mmol/L (ref 135–146)
Total Bilirubin: 0.5 mg/dL (ref 0.2–1.2)
Total Protein: 7.8 g/dL (ref 6.1–8.1)

## 2019-05-09 LAB — LIPID PANEL
Cholesterol: 186 mg/dL (ref <200)
HDL: 45 mg/dL — ABNORMAL LOW (ref >=50)
LDL Cholesterol (Calc): 125 mg/dL (calc) — ABNORMAL HIGH
Non-HDL Cholesterol (Calc): 141 mg/dL (calc) — ABNORMAL HIGH (ref <130)
Total CHOL/HDL Ratio: 4.1 (calc) (ref <5.0)
Triglycerides: 72 mg/dL (ref <150)

## 2019-05-10 LAB — PAP IG W/ RFLX HPV ASCU

## 2019-08-27 ENCOUNTER — Encounter: Payer: Self-pay | Admitting: Gynecology

## 2019-12-12 ENCOUNTER — Other Ambulatory Visit: Payer: Self-pay | Admitting: Women's Health

## 2019-12-12 DIAGNOSIS — Z1231 Encounter for screening mammogram for malignant neoplasm of breast: Secondary | ICD-10-CM

## 2019-12-13 ENCOUNTER — Inpatient Hospital Stay: Admission: RE | Admit: 2019-12-13 | Payer: 59 | Source: Ambulatory Visit

## 2020-01-22 ENCOUNTER — Ambulatory Visit: Payer: 59

## 2020-02-20 ENCOUNTER — Ambulatory Visit
Admission: RE | Admit: 2020-02-20 | Discharge: 2020-02-20 | Disposition: A | Discharge status: Home / Self Care | Payer: 59 | Source: Ambulatory Visit | Attending: Women's Health | Admitting: Women's Health

## 2020-02-20 ENCOUNTER — Other Ambulatory Visit: Payer: Self-pay

## 2020-02-20 DIAGNOSIS — Z1231 Encounter for screening mammogram for malignant neoplasm of breast: Secondary | ICD-10-CM

## 2020-05-13 ENCOUNTER — Other Ambulatory Visit: Payer: Self-pay

## 2020-05-13 ENCOUNTER — Encounter: Payer: Self-pay | Admitting: Nurse Practitioner

## 2020-05-13 ENCOUNTER — Ambulatory Visit (INDEPENDENT_AMBULATORY_CARE_PROVIDER_SITE_OTHER): Payer: 59 | Admitting: Nurse Practitioner

## 2020-05-13 VITALS — BP 124/80 | Ht 66.0 in | Wt 149.0 lb

## 2020-05-13 DIAGNOSIS — R87612 Low grade squamous intraepithelial lesion on cytologic smear of cervix (LGSIL): Secondary | ICD-10-CM | POA: Diagnosis not present

## 2020-05-13 DIAGNOSIS — Z13220 Encounter for screening for lipoid disorders: Secondary | ICD-10-CM

## 2020-05-13 DIAGNOSIS — Z01419 Encounter for gynecological examination (general) (routine) without abnormal findings: Secondary | ICD-10-CM

## 2020-05-13 LAB — COMPREHENSIVE METABOLIC PANEL
AG Ratio: 1.2 (calc) (ref 1.0–2.5)
ALT: 8 U/L (ref 6–29)
AST: 13 U/L (ref 10–30)
Albumin: 4.1 g/dL (ref 3.6–5.1)
Alkaline phosphatase (APISO): 51 U/L (ref 31–125)
BUN: 16 mg/dL (ref 7–25)
CO2: 23 mmol/L (ref 20–32)
Calcium: 9.2 mg/dL (ref 8.6–10.2)
Chloride: 104 mmol/L (ref 98–110)
Creat: 0.99 mg/dL (ref 0.50–1.10)
Globulin: 3.5 g/dL (calc) (ref 1.9–3.7)
Glucose, Bld: 85 mg/dL (ref 65–99)
Potassium: 3.8 mmol/L (ref 3.5–5.3)
Sodium: 137 mmol/L (ref 135–146)
Total Bilirubin: 0.3 mg/dL (ref 0.2–1.2)
Total Protein: 7.6 g/dL (ref 6.1–8.1)

## 2020-05-13 LAB — CBC WITH DIFFERENTIAL/PLATELET
Absolute Monocytes: 505 cells/uL (ref 200–950)
Basophils Absolute: 49 cells/uL (ref 0–200)
Basophils Relative: 1 %
Eosinophils Absolute: 412 cells/uL (ref 15–500)
Eosinophils Relative: 8.4 %
HCT: 38.8 % (ref 35.0–45.0)
Hemoglobin: 12.5 g/dL (ref 11.7–15.5)
Lymphs Abs: 1622 cells/uL (ref 850–3900)
MCH: 27.1 pg (ref 27.0–33.0)
MCHC: 32.2 g/dL (ref 32.0–36.0)
MCV: 84.2 fL (ref 80.0–100.0)
MPV: 10.4 fL (ref 7.5–12.5)
Monocytes Relative: 10.3 %
Neutro Abs: 2313 cells/uL (ref 1500–7800)
Neutrophils Relative %: 47.2 %
Platelets: 294 10*3/uL (ref 140–400)
RBC: 4.61 10*6/uL (ref 3.80–5.10)
RDW: 13.3 % (ref 11.0–15.0)
Total Lymphocyte: 33.1 %
WBC: 4.9 10*3/uL (ref 3.8–10.8)

## 2020-05-13 LAB — LIPID PANEL
Cholesterol: 188 mg/dL (ref <200)
HDL: 45 mg/dL — ABNORMAL LOW (ref >=50)
LDL Cholesterol (Calc): 127 mg/dL (calc) — ABNORMAL HIGH
Non-HDL Cholesterol (Calc): 143 mg/dL (calc) — ABNORMAL HIGH (ref <130)
Total CHOL/HDL Ratio: 4.2 (calc) (ref <5.0)
Triglycerides: 72 mg/dL (ref <150)

## 2020-05-13 NOTE — Addendum Note (Signed)
Addended by: Tito Dine on: 05/13/2020 08:59 AM   Modules accepted: Orders

## 2020-05-13 NOTE — Progress Notes (Signed)
   Christy Allen [birth year] [id number]   History:  44 y.o. G4 P3 presents for annual exam without GYN complaints. 2018 ASCUS with positive high risk HPV negative colposcopy and biopsy, 11/2018 LGSIL with confirmed biopsy. Monthly cycle/no contraception. History of recurrent yeast infections, reports having about 1 a month and using Diflucan with relief. Was treated with Diflucan and antifungal cream 1 month ago for tinea versicolor.   Gynecologic History Patient's last menstrual period was 04/24/2020. Period Cycle (Days): 28 Period Duration (Days): 5 Period Pattern: Regular Menstrual Flow: Moderate Menstrual Control: Maxi pad, Tampon Dysmenorrhea: (!) Mild Dysmenorrhea Symptoms: Cramping Contraception: none Last Pap: 05/08/2019. Results were: LGSIL Last mammogram: 02/10/2020. Results were: normal   Past medical history, past surgical history, family history and social history were all reviewed and documented in the EPIC chart. Works for [employer]. 3 children ages 13, 64.   ROS:  A ROS was performed and pertinent positive s and negatives are included.  Exam:  Vitals:   05/13/20 0814  BP: 124/80  Weight: 149 lb (67.6 kg)  Height: 5\' 6"  (1.676 m)   Body mass index is 24.05 kg/m.  General appearance:  Normal Thyroid:  Symmetrical, normal in size, without palpable masses or nodularity. Respiratory  Auscultation:  Clear without wheezing or rhonchi Cardiovascular  Auscultation:  Regular rate, without rubs, murmurs or gallops  Edema/varicosities:  Not grossly evident Abdominal  Soft,nontender, without masses, guarding or rebound.  Liver/spleen:  No organomegaly noted  Hernia:  None appreciated  Skin  Inspection:  Grossly normal   Breasts: Examined lying and sitting.   Right: Without masses, retractions, discharge or axillary adenopathy.   Left: Without masses, retractions, discharge or axillary adenopathy. Gentitourinary   Inguinal/mons:  Normal without inguinal  adenopathy  External genitalia:  Normal  BUS/Urethra/Skene's glands:  Normal  Vagina:  Normal  Cervix:  Normal  Uterus:  Retroverted, normal in size, shape and contour.  Midline and mobile  Adnexa/parametria:     Rt: Without masses or tenderness.   Lt: Without masses or tenderness.  Anus and perineum: Normal   Assessment/Plan:  44 y.o. G4 P3 for annual exam.    Well female exam with routine gynecological exam - Plan: CBC with Differential/Platelet, Comprehensive metabolic panel. Pap with HPV typing today. Education provided on SBEs, importance of preventative screenings, current guidelines, high calcium diet, regular exercise, and multivitamin daily.   Lipid screening - Plan: Lipid panel  Low grade squamous intraepithelial lesion on cytologic smear of cervix (LGSIL) - confirmed with biopsy 11/2018, 05/2019 + LGSIL. Repeat Pap today  Recurrent yeast infections - Diflucan as needed  Follow up in 1 year for annual      06/2019 [facility], 8:32 AM 05/13/2020

## 2020-05-13 NOTE — Patient Instructions (Signed)
Health Maintenance, Female Adopting a healthy lifestyle and getting preventive care are important in promoting health and wellness. Ask your health care provider about:  The right schedule for you to have regular tests and exams.  Things you can do on your own to prevent diseases and keep yourself healthy. What should I know about diet, weight, and exercise? Eat a healthy diet   Eat a diet that includes plenty of vegetables, fruits, low-fat dairy products, and lean protein.  Do not eat a lot of foods that are high in solid fats, added sugars, or sodium. Maintain a healthy weight Body mass index (BMI) is used to identify weight problems. It estimates body fat based on height and weight. Your health care provider can help determine your BMI and help you achieve or maintain a healthy weight. Get regular exercise Get regular exercise. This is one of the most important things you can do for your health. Most adults should:  Exercise for at least 150 minutes each week. The exercise should increase your heart rate and make you sweat (moderate-intensity exercise).  Do strengthening exercises at least twice a week. This is in addition to the moderate-intensity exercise.  Spend less time sitting. Even light physical activity can be beneficial. Watch cholesterol and blood lipids Have your blood tested for lipids and cholesterol at 42 years of age, then have this test every 5 years. Have your cholesterol levels checked more often if:  Your lipid or cholesterol levels are high.  You are older than 42 years of age.  You are at high risk for heart disease. What should I know about cancer screening? Depending on your health history and family history, you may need to have cancer screening at various ages. This may include screening for:  Breast cancer.  Cervical cancer.  Colorectal cancer.  Skin cancer.  Lung cancer. What should I know about heart disease, diabetes, and high blood  pressure? Blood pressure and heart disease  High blood pressure causes heart disease and increases the risk of stroke. This is more likely to develop in people who have high blood pressure readings, are of African descent, or are overweight.  Have your blood pressure checked: ? Every 3-5 years if you are 18-39 years of age. ? Every year if you are 40 years old or older. Diabetes Have regular diabetes screenings. This checks your fasting blood sugar level. Have the screening done:  Once every three years after age 40 if you are at a normal weight and have a low risk for diabetes.  More often and at a younger age if you are overweight or have a high risk for diabetes. What should I know about preventing infection? Hepatitis B If you have a higher risk for hepatitis B, you should be screened for this virus. Talk with your health care provider to find out if you are at risk for hepatitis B infection. Hepatitis C Testing is recommended for:  Everyone born from 1945 through 1965.  Anyone with known risk factors for hepatitis C. Sexually transmitted infections (STIs)  Get screened for STIs, including gonorrhea and chlamydia, if: ? You are sexually active and are younger than 42 years of age. ? You are older than 42 years of age and your health care provider tells you that you are at risk for this type of infection. ? Your sexual activity has changed since you were last screened, and you are at increased risk for chlamydia or gonorrhea. Ask your health care provider if   you are at risk.  Ask your health care provider about whether you are at high risk for HIV. Your health care provider may recommend a prescription medicine to help prevent HIV infection. If you choose to take medicine to prevent HIV, you should first get tested for HIV. You should then be tested every 3 months for as long as you are taking the medicine. Pregnancy  If you are about to stop having your period (premenopausal) and  you may become pregnant, seek counseling before you get pregnant.  Take 400 to 800 micrograms (mcg) of folic acid every day if you become pregnant.  Ask for birth control (contraception) if you want to prevent pregnancy. Osteoporosis and menopause Osteoporosis is a disease in which the bones lose minerals and strength with aging. This can result in bone fractures. If you are 65 years old or older, or if you are at risk for osteoporosis and fractures, ask your health care provider if you should:  Be screened for bone loss.  Take a calcium or vitamin D supplement to lower your risk of fractures.  Be given hormone replacement therapy (HRT) to treat symptoms of menopause. Follow these instructions at home: Lifestyle  Do not use any products that contain nicotine or tobacco, such as cigarettes, e-cigarettes, and chewing tobacco. If you need help quitting, ask your health care provider.  Do not use street drugs.  Do not share needles.  Ask your health care provider for help if you need support or information about quitting drugs. Alcohol use  Do not drink alcohol if: ? Your health care provider tells you not to drink. ? You are pregnant, may be pregnant, or are planning to become pregnant.  If you drink alcohol: ? Limit how much you use to 0-1 drink a day. ? Limit intake if you are breastfeeding.  Be aware of how much alcohol is in your drink. In the [place] one drink equals one 12 oz bottle of beer (355 mL), one 5 oz glass of wine (148 mL), or one 1 oz glass of hard liquor (44 mL). General instructions  Schedule regular health, dental, and eye exams.  Stay current with your vaccines.  Tell your health care provider if: ? You often feel depressed. ? You have ever been abused or do not feel safe at home. Summary  Adopting a healthy lifestyle and getting preventive care are important in promoting health and wellness.  Follow your health care provider's instructions about healthy  diet, exercising, and getting tested or screened for diseases.  Follow your health care provider's instructions on monitoring your cholesterol and blood pressure. This information is not intended to replace advice given to you by your health care provider. Make sure you discuss any questions you have with your health care provider. Document Revised: 11/14/2018 Document Reviewed: 11/14/2018 Elsevier Patient Education  2020 Elsevier Inc.  

## 2020-05-15 LAB — PAP, TP IMAGING W/ HPV RNA, RFLX HPV TYPE 16,18/45: HPV DNA High Risk: DETECTED — AB

## 2020-05-27 ENCOUNTER — Ambulatory Visit (INDEPENDENT_AMBULATORY_CARE_PROVIDER_SITE_OTHER): Payer: 59 | Admitting: Obstetrics and Gynecology

## 2020-05-27 ENCOUNTER — Other Ambulatory Visit: Payer: Self-pay

## 2020-05-27 ENCOUNTER — Encounter: Payer: Self-pay | Admitting: Obstetrics and Gynecology

## 2020-05-27 VITALS — BP 118/66

## 2020-05-27 DIAGNOSIS — Z87410 Personal history of cervical dysplasia: Secondary | ICD-10-CM | POA: Diagnosis not present

## 2020-05-27 DIAGNOSIS — R87612 Low grade squamous intraepithelial lesion on cytologic smear of cervix (LGSIL): Secondary | ICD-10-CM

## 2020-05-27 DIAGNOSIS — N871 Moderate cervical dysplasia: Secondary | ICD-10-CM

## 2020-05-27 NOTE — Progress Notes (Signed)
   Christy Allen 12-21-1975 [id number]  SUBJECTIVE:  44 y.o. [id number] female presents for a colposcopy exam. She has a longstanding history of LGSIL Pap smears. 05/2020 LGSIL with positive high-risk HPV 05/2019 LGSIL 04/2018 LGSIL, colposcopy no external findings with ECC indicating CIN-1 01/2017 ASCUS with positive high-risk HPV, negative for type 16, 18, 45.  ECC indicating CIN-1.  Current Outpatient Medications  Medication Sig Dispense Refill  . Cyanocobalamin (VITAMIN B12 PO) Take 1 tablet by mouth daily.     Marland Kitchen ELDERBERRY PO Take 1 tablet by mouth daily.    . Multiple Vitamin (MULTIVITAMIN WITH MINERALS) TABS tablet Take 1 tablet by mouth daily.     No current facility-administered medications for this visit.   Allergies: Patient has no known allergies.  Patient's last menstrual period was 05/20/2020.  Past medical history,surgical history, problem list, medications, allergies, family history and social history were all reviewed and documented as reviewed in the EPIC chart.   OBJECTIVE:  BP 118/66   LMP 05/20/2020  The patient appears well, alert, oriented x 3, in no distress.  Procedure note Colposcopy The cervix was visualized.  The cervix is large and patulous.  Graves speculum was utilized.  Dilute acetic acid cleanse was performed.  Upper vaginal sidewalls and vaginal fornices are inspected and negative for any lesions.  At the posterior aspect of the cervical portio just outside of the external cervical os there are small but thick acetowhite plaques present just at the corners at 4 and 8:00 of the external os and a shelf of acetowhite change diffusely from 4-8 o'clock.  No punctations or abnormal vasculature are appreciated.  Transformation zone is seen in its entirety circumferentially so today's exam is adequate.   Physical Exam Genitourinary:        Chaperone: Kennon Portela present during the examination and procedure   ASSESSMENT:  44 y.o. [phone] here for  colposcopy exam with persistent LGSIL Pap smear, CIN-1 on previous ECC  PLAN:  The role of HPV in mediating cervical dysplasia changes is reviewed.  Sometimes it will take a long time to clear the virus.  Recommend taking care of oneself with a healthy diet and getting adequate rest to allow immune system to function optimally.  She is sexually active with only one partner.  Not on any birth control pills and not smoking.  We will review pathology report results when available which will dictate next steps in management.  Theresia Majors MD 05/27/20

## 2020-05-29 LAB — PATHOLOGY REPORT

## 2020-05-29 LAB — TISSUE SPECIMEN

## 2020-05-29 NOTE — Progress Notes (Signed)
I called the patient to discuss the cervical biopsy results ordering CIN-2, and with the persistent abnormal Pap smear, she decided that she wanted to proceed with cervical conization.  We discussed LEEP versus conization in the OR under anesthesia and she prefers the latter.  Please call her to make an appointment (in person or virtual) to discuss the procedure in more detail.

## 2020-06-04 ENCOUNTER — Encounter: Payer: Self-pay | Admitting: Obstetrics and Gynecology

## 2020-06-04 ENCOUNTER — Telehealth (INDEPENDENT_AMBULATORY_CARE_PROVIDER_SITE_OTHER): Payer: 59 | Admitting: Obstetrics and Gynecology

## 2020-06-04 ENCOUNTER — Other Ambulatory Visit: Payer: Self-pay

## 2020-06-04 DIAGNOSIS — R87612 Low grade squamous intraepithelial lesion on cytologic smear of cervix (LGSIL): Secondary | ICD-10-CM

## 2020-06-04 DIAGNOSIS — N871 Moderate cervical dysplasia: Secondary | ICD-10-CM | POA: Diagnosis not present

## 2020-06-04 MED ORDER — DIAZEPAM 5 MG PO TABS: 5.0000 mg | ORAL_TABLET | Freq: Once | ORAL | 0 refills | Status: AC

## 2020-06-04 NOTE — Progress Notes (Addendum)
   Raja Caputi [birth year] [id number]  SUBJECTIVE:  44 y.o. [id number] female presents via video conference for discussion of management of cervical dysplasia.  Name and date of birth are verified.  Patient agreed to having a remote encounter today.  She had a colposcopy 05/27/2020 with a biopsy indicating CIN-1 with features bordering CIN-2.  As outlined in the previous note, her previous 3 Pap smears have been LGSIL.  I requested that she make an appointment today to discuss management of the cervical dysplasia.  Current Outpatient Medications  Medication Sig Dispense Refill  . Cyanocobalamin (VITAMIN B12 PO) Take 1 tablet by mouth daily.     Marland Kitchen ELDERBERRY PO Take 1 tablet by mouth daily.    . Multiple Vitamin (MULTIVITAMIN WITH MINERALS) TABS tablet Take 1 tablet by mouth daily.     No current facility-administered medications for this visit.   Allergies: Patient has no known allergies.  Patient's last menstrual period was 05/20/2020.  Past medical history,surgical history, problem list, medications, allergies, family history and social history were all reviewed and documented as reviewed in the EPIC chart.   OBJECTIVE:  LMP 05/20/2020  The patient appears well, alert, oriented, in no distress. Sam is limited as this is a virtual visit PELVIC EXAM: performed at last visit  ASSESSMENT:  44 y.o. [phone] presenting virtually for discussion of LEEP versus conization for borderline CIN-2  PLAN:  Given the ongoing abnormal Pap smear and this most recent biopsy demonstrating a bit of progression of the cervical dysplasia compared to her previous findings, I discussed my recommendation to proceed with a LEEP versus cold knife conization procedure. I discussed the pros and cons of each approach.  With any conization procedure, there is risk of infection, bleeding, possible need for blood transfusion, and possible need for hysterectomy in case of intractable bleeding.  She does not desire future  childbearing.   There is risk of injury to surrounding organ structures including the bladder, ureter, and rectum.  I discussed the concept of trying to obtain disease-free surgical margins, but this is not always possible.  If there is residual cervical dysplasia left behind, there may need to be another attempt at excision in the future.  Also possible is there being only mild dysplasia remaining in the cone biopsy specimen which she acknowledges understanding of. Given the small visual area of visible cervical dysplasia on her recent colposcopy, I think it would be completely adequate to perform a LEEP in the office.  We discussed some of the procedural considerations and if she is concerned about anxiety with doing the procedure, I did offer to prescribe her an anxiolytic to take within 20 to 30 minutes of doing the procedure.  I will give her a prescription for Valium 5 mg #2 tabs.  She will need a driver to bring her to the procedure.  Postoperative recovery expectations and restrictions are discussed.  She is comfortable with proceeding with LEEP.  We will have staff contact her to get the procedure scheduled.  All questions were answered.    Theresia Majors MD 06/04/20

## 2020-06-05 ENCOUNTER — Telehealth: Payer: Self-pay

## 2020-06-05 NOTE — Telephone Encounter (Signed)
Spoke with patient and informed her. °

## 2020-06-05 NOTE — Telephone Encounter (Signed)
Called patient to remind her that she will need a driver to and from her LEEP appt on 06/18/20 as she will be taking the Valium before she comes and we do not want her to drive impaired.  She understood.

## 2020-06-05 NOTE — Telephone Encounter (Signed)
Thanks. Also need to clarify that she will not need to be off work for 3 weeks, just lifting/activity restrictions for 3 weeks

## 2020-06-18 ENCOUNTER — Ambulatory Visit: Payer: 59 | Admitting: Obstetrics and Gynecology

## 2020-07-09 ENCOUNTER — Other Ambulatory Visit: Payer: Self-pay

## 2020-07-09 ENCOUNTER — Encounter: Payer: Self-pay | Admitting: Obstetrics and Gynecology

## 2020-07-09 ENCOUNTER — Ambulatory Visit (INDEPENDENT_AMBULATORY_CARE_PROVIDER_SITE_OTHER): Payer: 59 | Admitting: Obstetrics and Gynecology

## 2020-07-09 VITALS — BP 118/78

## 2020-07-09 DIAGNOSIS — N871 Moderate cervical dysplasia: Secondary | ICD-10-CM

## 2020-07-09 DIAGNOSIS — Z87410 Personal history of cervical dysplasia: Secondary | ICD-10-CM

## 2020-07-09 DIAGNOSIS — R87612 Low grade squamous intraepithelial lesion on cytologic smear of cervix (LGSIL): Secondary | ICD-10-CM

## 2020-07-09 NOTE — Progress Notes (Signed)
   Christy Allen 15-Feb-1976 [id number]  SUBJECTIVE:  44 y.o. [id number] female presents for a LEEP.  She did take some Valium prescribed to her before hand.  She had a colposcopy 05/27/2020 with a biopsy indicating CIN-1 with features bordering CIN-2.  As outlined in the previous notes, her previous 3 Pap smears have been LGSIL.   Allergies: Patient has no known allergies.  Patient's last menstrual period was 06/18/2020.  Past medical history,surgical history, problem list, medications, allergies, family history and social history were all reviewed and documented as reviewed in the EPIC chart.   OBJECTIVE:  BP 118/78 (BP Location: Right Arm, Patient Position: Sitting, Cuff Size: Normal)   LMP 06/18/2020   LEEP procedure note  The cervix was visualized with the use of the power coated speculum.  A dilute acetic acid cleanse was applied to the cervix.  Using the colposcope, the abnormal appearing tissue with acetowhite change was visualized.  The green electrode was selected to try to encompass the entire area with one singular sweep.  A paracervical block was applied with 1% lidocaine (7 mL).  Cautery was set at 65/65.  The smoke evacuation was activated and the loop electrocautery wire was inserted into the cervix at the left aspect and lateral border of the abnormal tissue and swept across the cervical portio and exited on the patient's right side.  The cervical cone specimen was placed on a tack board and a blue tack was applied at 12:00, purple tack was applied at 6:00.  There was mild bleeding from the cervical bed so Monsels solution and pressure was applied to the area.  Once hemostasis was obtained, a Kevorkian curette was used to sample the endocervical canal.  Cytobrush was used to collect remaining sample from the endocervix.  Excellent hemostasis was noted.  The patient tolerated the procedure well.  Chaperone: KimAlexis Bonham was present during the examination and  procedure.   ASSESSMENT:  44 y.o. [id number] here for LEEP due to suspected borderline CIN-2 (on recent colposcopy directed biopsy) and persistent low-grade abnormal Pap smear  PLAN:  Next steps in management will be dictated by the results of today's pathology result, I will let the patient know soon as possible.  Post procedure precautions including aching evaluation for excessive bleeding and expectations of vaginal discharge are reviewed.  She tolerated the procedure well without complication.  Theresia Majors MD 07/09/20

## 2020-07-14 LAB — TISSUE PATH REPORT

## 2020-07-14 LAB — PATHOLOGY REPORT

## 2021-03-11 ENCOUNTER — Other Ambulatory Visit: Payer: Self-pay | Admitting: Nurse Practitioner

## 2021-03-11 ENCOUNTER — Telehealth: Payer: Self-pay | Admitting: *Deleted

## 2021-03-11 DIAGNOSIS — B373 Candidiasis of vulva and vagina: Secondary | ICD-10-CM

## 2021-03-11 DIAGNOSIS — B3731 Acute candidiasis of vulva and vagina: Secondary | ICD-10-CM

## 2021-03-11 MED ORDER — FLUCONAZOLE 100 MG PO TABS: 100.0000 mg | ORAL_TABLET | Freq: Every day | ORAL | 0 refills | Status: AC

## 2021-03-11 NOTE — Telephone Encounter (Signed)
Christy Allen see below I called patient c/o yeast infection vaginal itching only, she will be leaving tonight for a flight to [place] asked if diflucan tablets can be sent to pharmacy? Reports she has history of recurrent yeast. Patient said you can send to [facility] listed in chart. Please advise

## 2021-03-11 NOTE — Telephone Encounter (Signed)
-----   Message from Lillia Carmel, [place] sent at 03/11/2021 12:22 PM EDT ----- Patient needs a refill on Diflucan, states she "used the wrong soap and does not have any more medication". She would like it to be filled at the [facility]  [address], [place], [place] [address].

## 2021-03-11 NOTE — Telephone Encounter (Signed)
Refill sent.

## 2021-07-23 ENCOUNTER — Other Ambulatory Visit: Payer: Self-pay | Admitting: Obstetrics and Gynecology

## 2021-07-23 DIAGNOSIS — Z1231 Encounter for screening mammogram for malignant neoplasm of breast: Secondary | ICD-10-CM

## 2021-07-24 ENCOUNTER — Ambulatory Visit
Admission: RE | Admit: 2021-07-24 | Discharge: 2021-07-24 | Disposition: A | Discharge status: Home / Self Care | Payer: 59 | Source: Ambulatory Visit | Attending: Obstetrics and Gynecology | Admitting: Obstetrics and Gynecology

## 2021-07-24 ENCOUNTER — Other Ambulatory Visit: Payer: Self-pay

## 2021-07-24 DIAGNOSIS — Z1231 Encounter for screening mammogram for malignant neoplasm of breast: Secondary | ICD-10-CM

## 2021-08-18 ENCOUNTER — Other Ambulatory Visit: Payer: Self-pay | Admitting: Obstetrics and Gynecology

## 2021-08-18 DIAGNOSIS — N6310 Unspecified lump in the right breast, unspecified quadrant: Secondary | ICD-10-CM

## 2021-08-23 ENCOUNTER — Ambulatory Visit
Admission: RE | Admit: 2021-08-23 | Discharge: 2021-08-23 | Disposition: A | Discharge status: Home / Self Care | Payer: 59 | Source: Ambulatory Visit | Attending: Obstetrics and Gynecology | Admitting: Obstetrics and Gynecology

## 2021-08-23 ENCOUNTER — Ambulatory Visit: Payer: 59

## 2021-08-23 ENCOUNTER — Other Ambulatory Visit: Payer: Self-pay

## 2021-08-23 DIAGNOSIS — N6310 Unspecified lump in the right breast, unspecified quadrant: Secondary | ICD-10-CM

## 2021-11-01 ENCOUNTER — Other Ambulatory Visit: Payer: Self-pay | Admitting: Nurse Practitioner

## 2021-11-01 DIAGNOSIS — B3731 Acute candidiasis of vulva and vagina: Secondary | ICD-10-CM

## 2022-08-22 ENCOUNTER — Other Ambulatory Visit: Payer: Self-pay | Admitting: Thoracic Surgery

## 2022-08-22 ENCOUNTER — Other Ambulatory Visit: Payer: Self-pay | Admitting: Obstetrics and Gynecology

## 2022-08-22 DIAGNOSIS — Z1231 Encounter for screening mammogram for malignant neoplasm of breast: Secondary | ICD-10-CM

## 2022-09-14 ENCOUNTER — Ambulatory Visit
Admission: RE | Admit: 2022-09-14 | Discharge: 2022-09-14 | Disposition: A | Discharge status: Home / Self Care | Payer: 59 | Source: Ambulatory Visit

## 2022-09-14 DIAGNOSIS — Z1231 Encounter for screening mammogram for malignant neoplasm of breast: Secondary | ICD-10-CM

## 2023-08-22 ENCOUNTER — Other Ambulatory Visit: Payer: Self-pay | Admitting: Internal Medicine

## 2023-08-22 DIAGNOSIS — Z1231 Encounter for screening mammogram for malignant neoplasm of breast: Secondary | ICD-10-CM

## 2023-09-05 IMAGING — US US BREAST*R* LIMITED INC AXILLA
1 series · 5 of 5 positions shown · non-contrast
Comparison: Previous exam(s).

CLINICAL DATA: The patient recently had painful, palpable cord-like
regions in the right breast. The symptoms have nearly resolved.

EXAM:
DIGITAL DIAGNOSTIC UNILATERAL RIGHT MAMMOGRAM WITH TOMOSYNTHESIS AND
CAD; ULTRASOUND RIGHT BREAST LIMITED
TECHNIQUE: Right digital diagnostic mammography and breast tomosynthesis was
performed. The images were evaluated with computer-aided detection.;
Targeted ultrasound examination of the right breast was performed

[Series 1: us breast*right* limited inc axilla · 0.06mm/px · 5 of 5 slices shown]
[im 1/5]
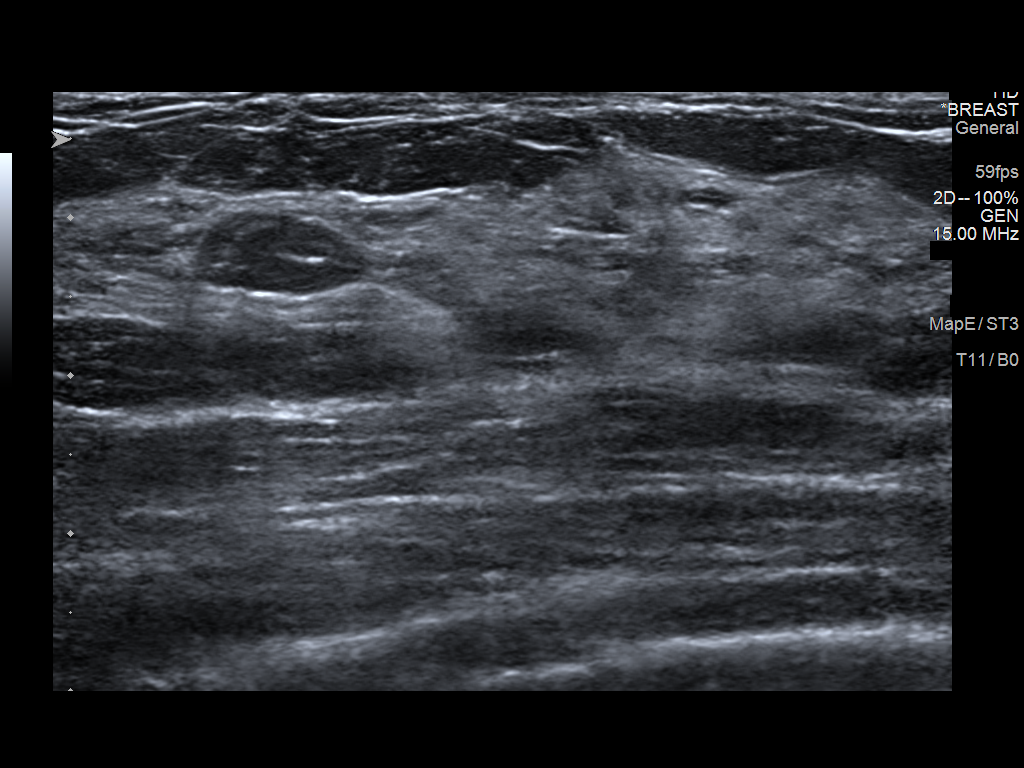
[im 2/5]
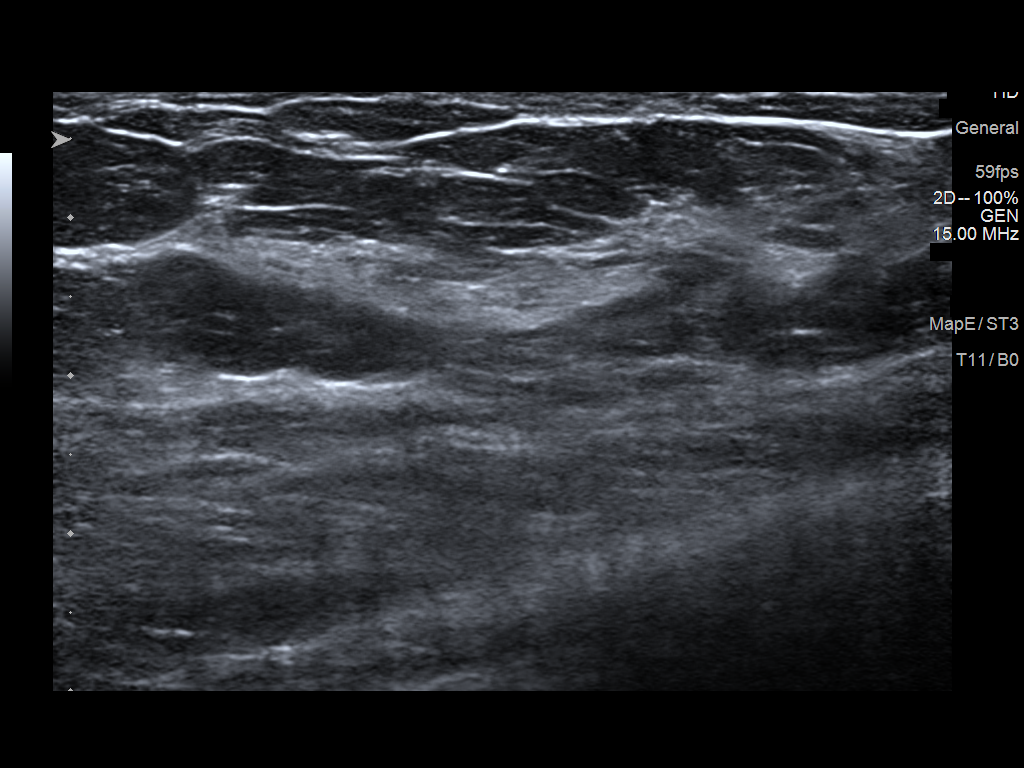
[im 3/5]
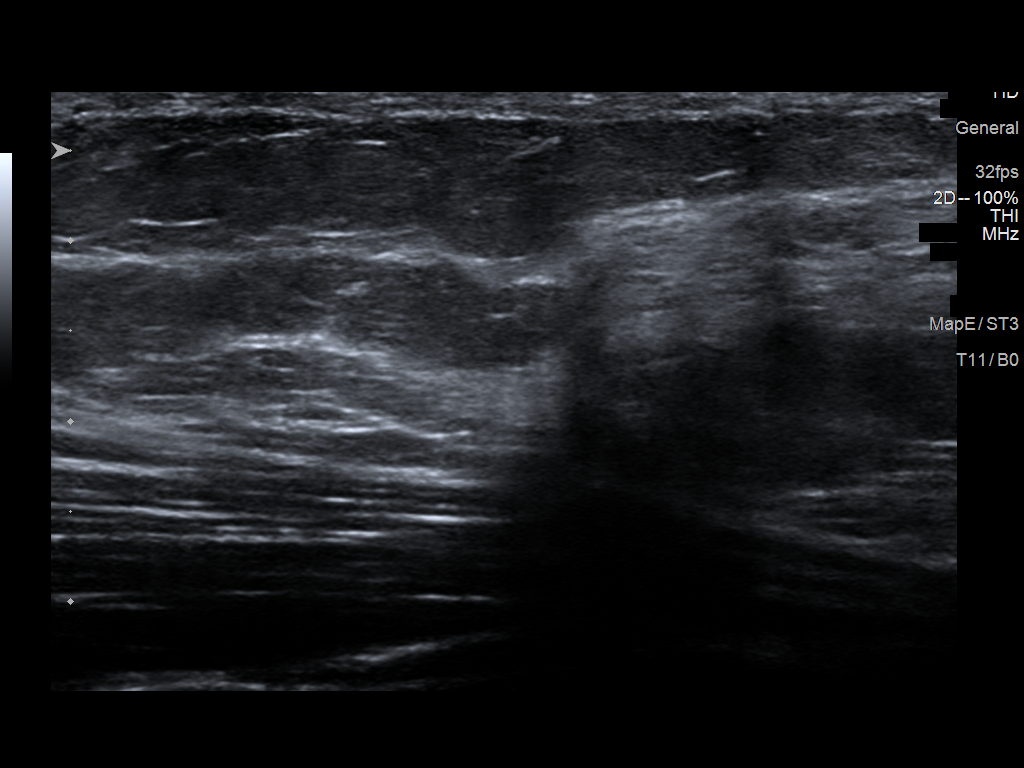
[im 4/5]
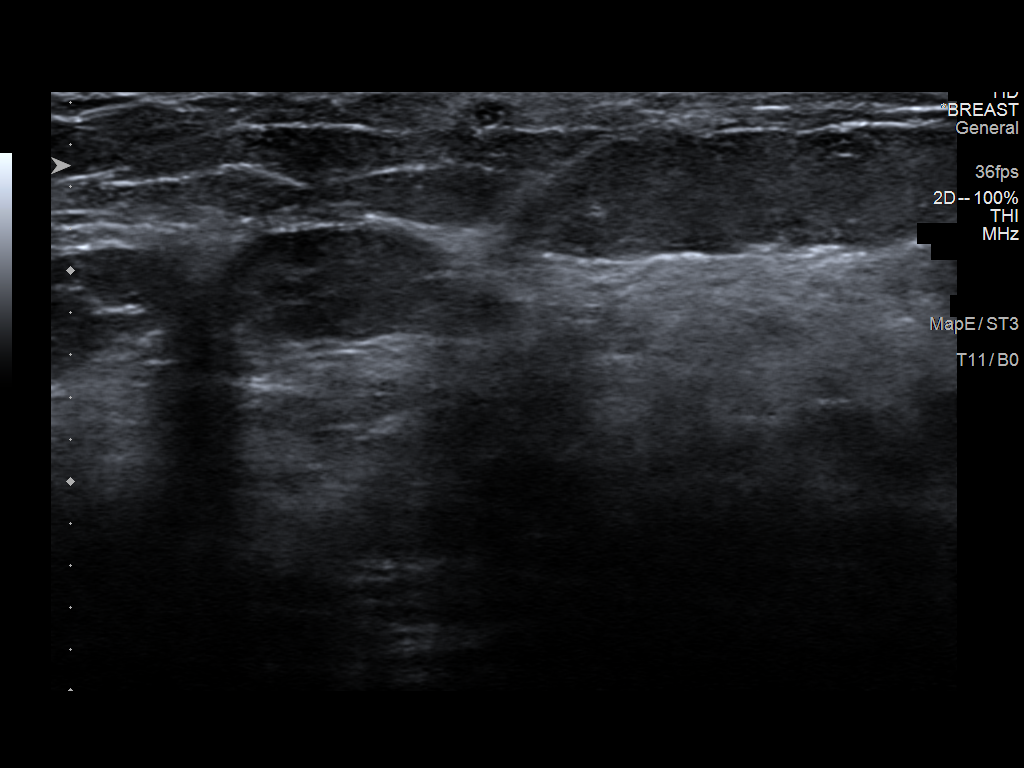
[im 5/5]
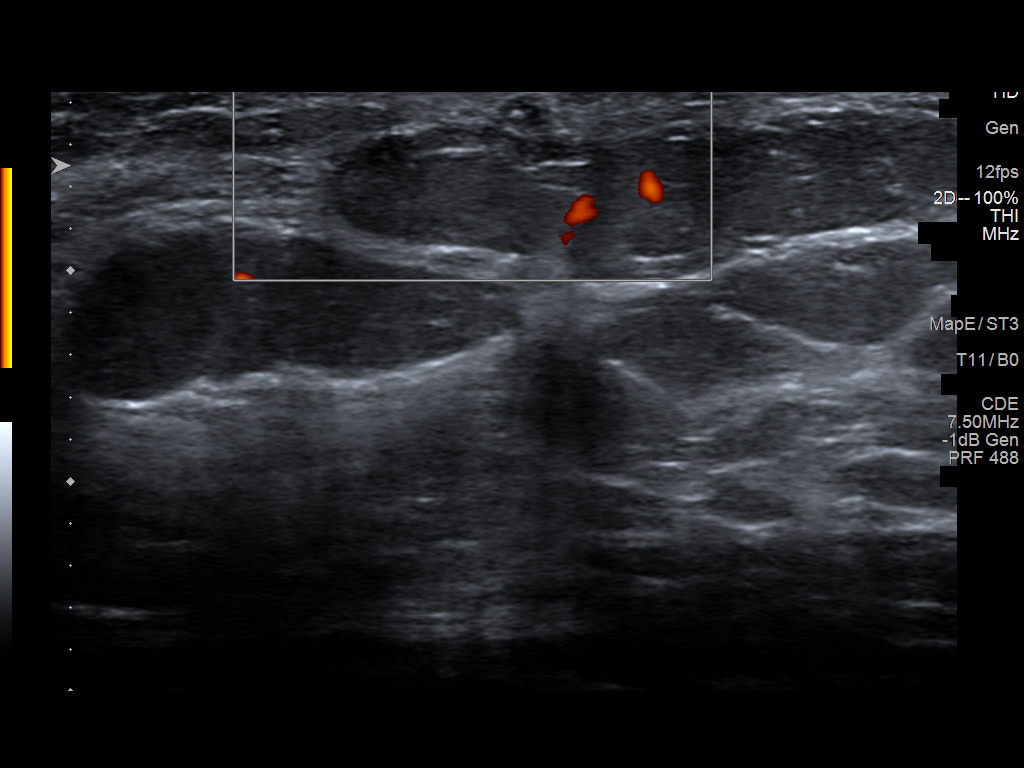

[5 of 5 positions shown; findings below may reference images not displayed]

ACR Breast Density Category c: The breast tissue is heterogeneously
dense, which may obscure small masses.
FINDINGS: No suspicious masses, calcifications, or distortion are identified
in the right breast.

Targeted ultrasound is performed, showing no abnormalities in the
region of the patient's recent symptoms at 12 o'clock, 6 cm from the
nipple and 12 o'clock 7 cm from the nipple. There is a tubular
structure in the region of the patient's symptoms at [DATE]. On
real-time imaging, I suspect this is a thrombosed vessel.
IMPRESSION: No evidence of malignancy. The patient's symptoms and the
sonographic findings at [DATE] suggest Mondor's disease.

RECOMMENDATION:
Annual screening mammography.

I have discussed the findings and recommendations with the patient.
If applicable, a reminder letter will be sent to the patient
regarding the next appointment.

BI-RADS CATEGORY  2: Benign.

## 2023-09-05 IMAGING — MG MM DIGITAL DIAGNOSTIC UNILAT*R* W/ TOMO W/ CAD
4 series · 4 of 12 positions shown · non-contrast
Comparison: Previous exam(s).

CLINICAL DATA: The patient recently had painful, palpable cord-like
regions in the right breast. The symptoms have nearly resolved.

EXAM:
DIGITAL DIAGNOSTIC UNILATERAL RIGHT MAMMOGRAM WITH TOMOSYNTHESIS AND
CAD; ULTRASOUND RIGHT BREAST LIMITED
TECHNIQUE: Right digital diagnostic mammography and breast tomosynthesis was
performed. The images were evaluated with computer-aided detection.;
Targeted ultrasound examination of the right breast was performed

[R MLO synth-2D]
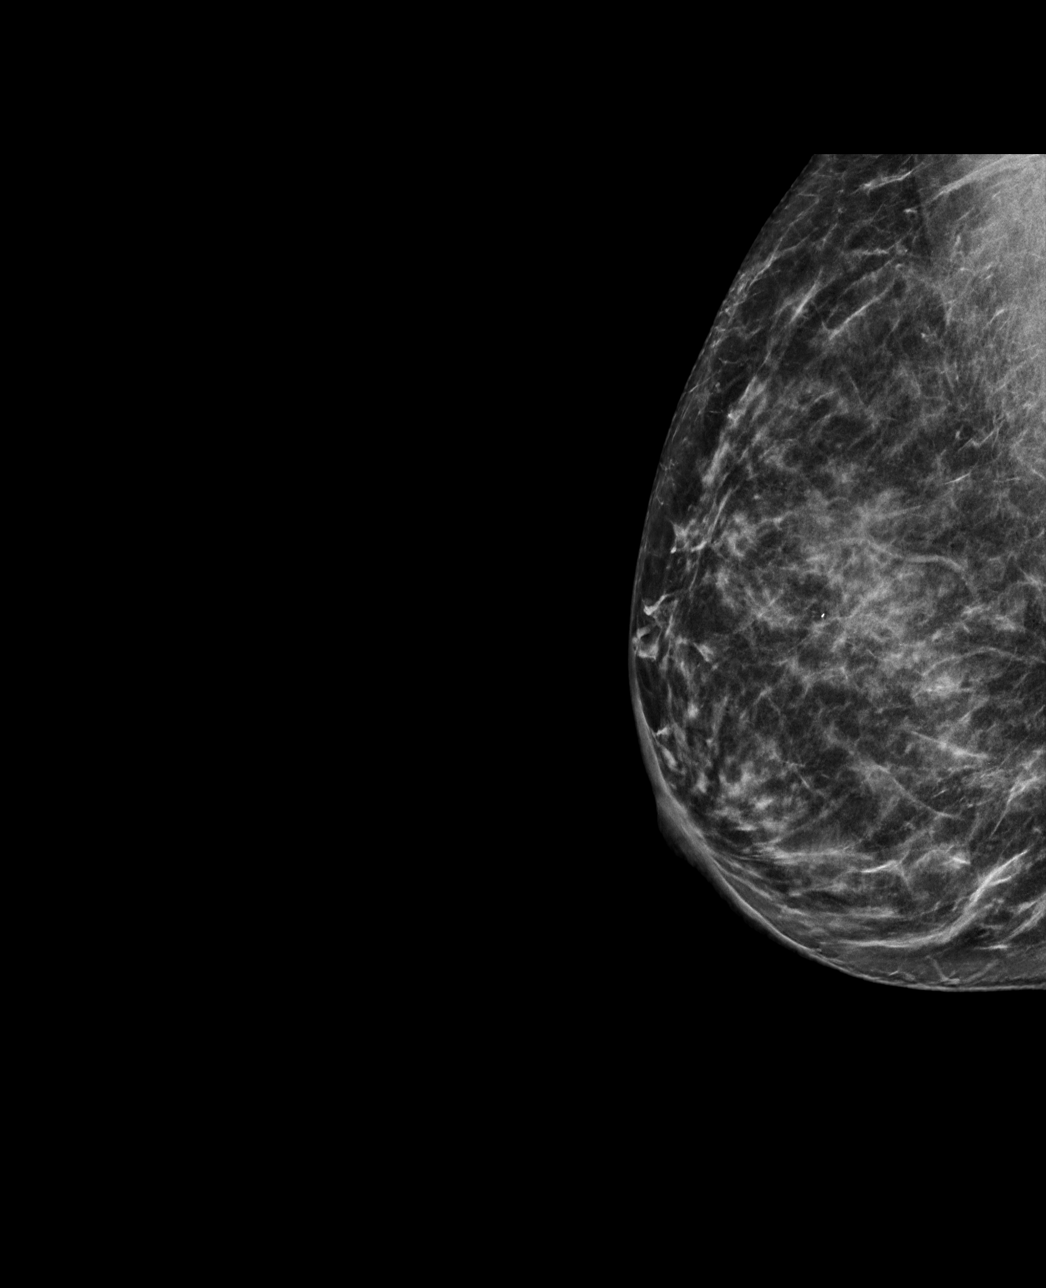

[R CC synth-2D]
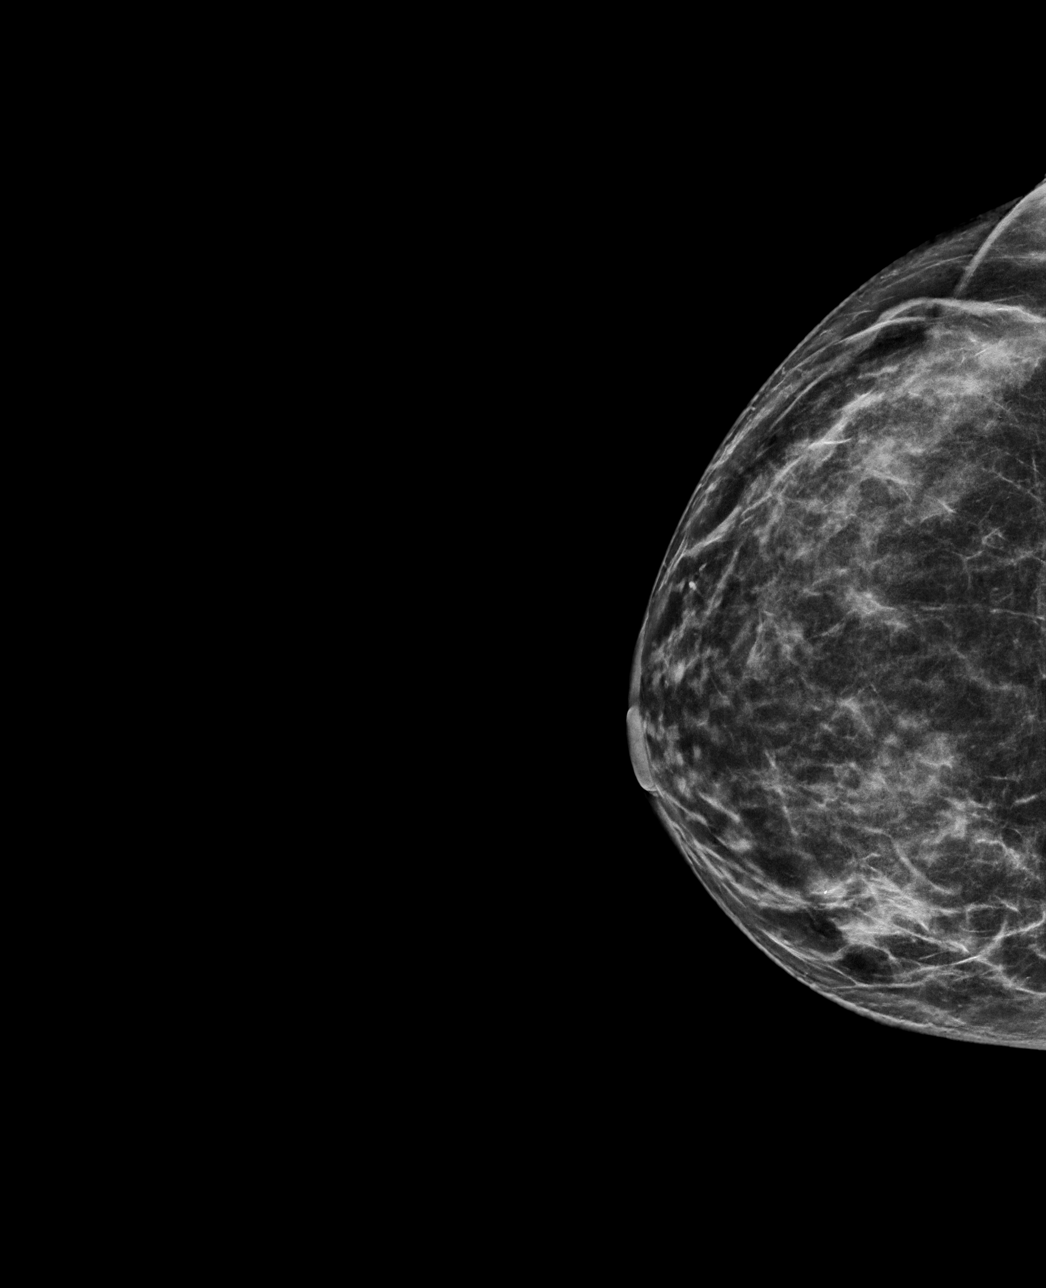

[R MLO tomo · tomo slice 33/66.0]
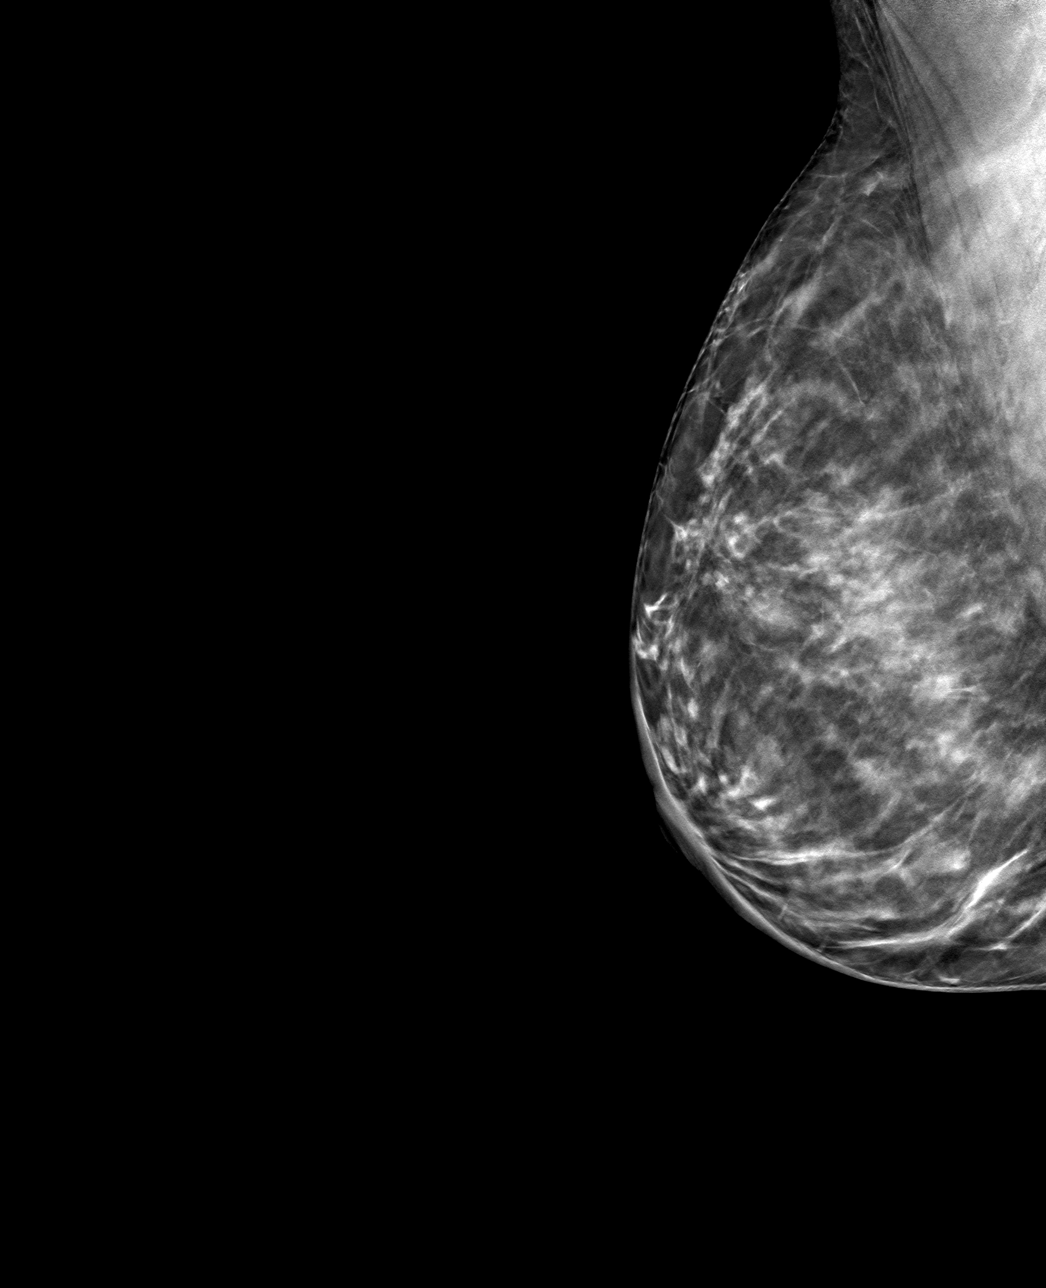

[R CC tomo · tomo slice 35/69.0]
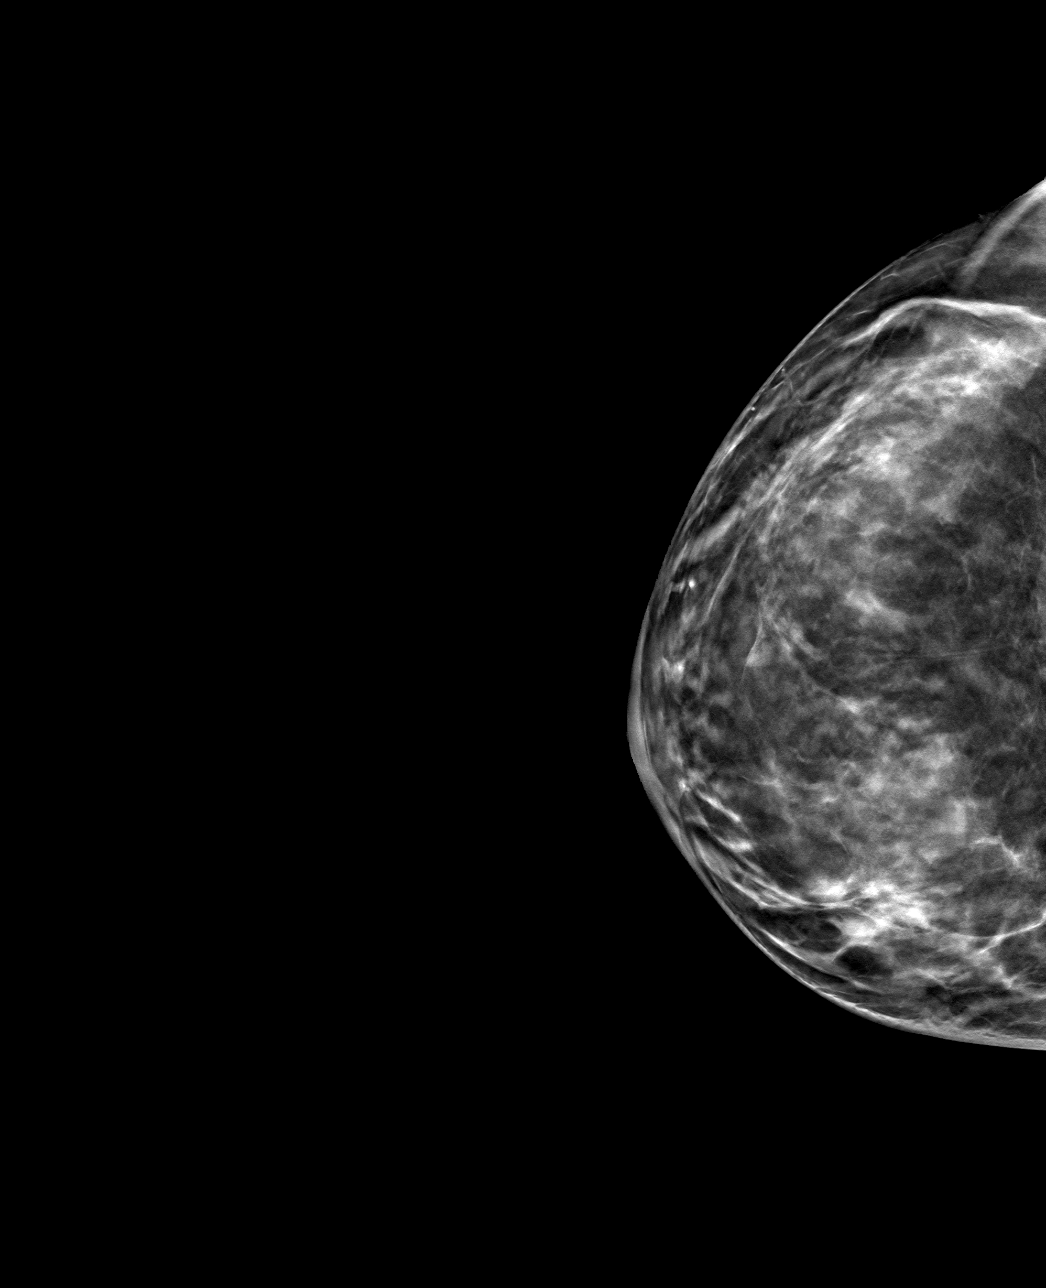

[4 of 12 positions shown; findings below may reference images not displayed]

ACR Breast Density Category c: The breast tissue is heterogeneously
dense, which may obscure small masses.
FINDINGS: No suspicious masses, calcifications, or distortion are identified
in the right breast.

Targeted ultrasound is performed, showing no abnormalities in the
region of the patient's recent symptoms at 12 o'clock, 6 cm from the
nipple and 12 o'clock 7 cm from the nipple. There is a tubular
structure in the region of the patient's symptoms at [DATE]. On
real-time imaging, I suspect this is a thrombosed vessel.
IMPRESSION: No evidence of malignancy. The patient's symptoms and the
sonographic findings at [DATE] suggest Mondor's disease.

RECOMMENDATION:
Annual screening mammography.

I have discussed the findings and recommendations with the patient.
If applicable, a reminder letter will be sent to the patient
regarding the next appointment.

BI-RADS CATEGORY  2: Benign.

## 2023-09-18 DIAGNOSIS — Z1231 Encounter for screening mammogram for malignant neoplasm of breast: Secondary | ICD-10-CM

## 2023-09-20 ENCOUNTER — Other Ambulatory Visit: Payer: Self-pay | Admitting: Internal Medicine

## 2023-09-20 DIAGNOSIS — Z1231 Encounter for screening mammogram for malignant neoplasm of breast: Secondary | ICD-10-CM

## 2023-09-29 ENCOUNTER — Ambulatory Visit
Admission: RE | Admit: 2023-09-29 | Discharge: 2023-09-29 | Disposition: A | Discharge status: Home / Self Care | Payer: 59 | Source: Ambulatory Visit

## 2023-09-29 DIAGNOSIS — Z1231 Encounter for screening mammogram for malignant neoplasm of breast: Secondary | ICD-10-CM

## 2024-09-30 ENCOUNTER — Encounter

## 2024-09-30 DIAGNOSIS — Z1231 Encounter for screening mammogram for malignant neoplasm of breast: Secondary | ICD-10-CM

## 2024-10-04 ENCOUNTER — Ambulatory Visit
Admission: RE | Admit: 2024-10-04 | Discharge: 2024-10-04 | Disposition: A | Discharge status: Home / Self Care | Source: Ambulatory Visit | Attending: Internal Medicine | Admitting: Internal Medicine

## 2024-10-04 ENCOUNTER — Other Ambulatory Visit: Payer: Self-pay | Admitting: Internal Medicine

## 2024-10-04 DIAGNOSIS — Z1231 Encounter for screening mammogram for malignant neoplasm of breast: Secondary | ICD-10-CM
